# Patient Record
Sex: Female | Born: 1970 | Race: Black or African American | Hispanic: No | Marital: Married | Smoking: Current every day smoker
Health system: Southern US, Community
[De-identification: ages and names within clinical notes are randomized; demographics above are authoritative.]

## PROBLEM LIST (undated history)

## (undated) DIAGNOSIS — K859 Acute pancreatitis without necrosis or infection, unspecified: Secondary | ICD-10-CM

---

## 1999-08-03 ENCOUNTER — Encounter: Payer: Self-pay | Admitting: Emergency Medicine

## 1999-08-03 ENCOUNTER — Emergency Department (HOSPITAL_COMMUNITY): Admission: EM | Admit: 1999-08-03 | Discharge: 1999-08-03 | Payer: Self-pay | Admitting: Emergency Medicine

## 2005-05-25 ENCOUNTER — Emergency Department (HOSPITAL_COMMUNITY): Admission: EM | Admit: 2005-05-25 | Discharge: 2005-05-25 | Payer: Self-pay | Admitting: Emergency Medicine

## 2013-04-03 ENCOUNTER — Emergency Department (HOSPITAL_BASED_OUTPATIENT_CLINIC_OR_DEPARTMENT_OTHER)
Admission: EM | Admit: 2013-04-03 | Discharge: 2013-04-03 | Disposition: A | Discharge status: Home / Self Care | Payer: No Typology Code available for payment source | Attending: Emergency Medicine | Admitting: Emergency Medicine

## 2013-04-03 ENCOUNTER — Encounter (HOSPITAL_BASED_OUTPATIENT_CLINIC_OR_DEPARTMENT_OTHER): Payer: Self-pay | Admitting: *Deleted

## 2013-04-03 DIAGNOSIS — S59909A Unspecified injury of unspecified elbow, initial encounter: Secondary | ICD-10-CM | POA: Insufficient documentation

## 2013-04-03 DIAGNOSIS — M791 Myalgia, unspecified site: Secondary | ICD-10-CM

## 2013-04-03 DIAGNOSIS — S59919A Unspecified injury of unspecified forearm, initial encounter: Secondary | ICD-10-CM | POA: Insufficient documentation

## 2013-04-03 DIAGNOSIS — Y9389 Activity, other specified: Secondary | ICD-10-CM | POA: Insufficient documentation

## 2013-04-03 DIAGNOSIS — IMO0002 Reserved for concepts with insufficient information to code with codable children: Secondary | ICD-10-CM | POA: Insufficient documentation

## 2013-04-03 DIAGNOSIS — F17200 Nicotine dependence, unspecified, uncomplicated: Secondary | ICD-10-CM | POA: Insufficient documentation

## 2013-04-03 DIAGNOSIS — Y92410 Unspecified street and highway as the place of occurrence of the external cause: Secondary | ICD-10-CM | POA: Insufficient documentation

## 2013-04-03 DIAGNOSIS — S6990XA Unspecified injury of unspecified wrist, hand and finger(s), initial encounter: Secondary | ICD-10-CM | POA: Insufficient documentation
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
## 2013-04-03 NOTE — ED Notes (Signed)
MD at bedside. 

## 2015-02-22 ENCOUNTER — Emergency Department (HOSPITAL_BASED_OUTPATIENT_CLINIC_OR_DEPARTMENT_OTHER)
Admission: EM | Admit: 2015-02-22 | Discharge: 2015-02-22 | Disposition: A | Discharge status: Home / Self Care | Payer: Self-pay | Attending: Emergency Medicine | Admitting: Emergency Medicine

## 2015-02-22 ENCOUNTER — Emergency Department (HOSPITAL_BASED_OUTPATIENT_CLINIC_OR_DEPARTMENT_OTHER): Payer: Self-pay

## 2015-02-22 ENCOUNTER — Encounter (HOSPITAL_BASED_OUTPATIENT_CLINIC_OR_DEPARTMENT_OTHER): Payer: Self-pay

## 2015-02-22 DIAGNOSIS — Z3202 Encounter for pregnancy test, result negative: Secondary | ICD-10-CM | POA: Insufficient documentation

## 2015-02-22 DIAGNOSIS — Z79899 Other long term (current) drug therapy: Secondary | ICD-10-CM | POA: Insufficient documentation

## 2015-02-22 DIAGNOSIS — K8500 Idiopathic acute pancreatitis without necrosis or infection: Secondary | ICD-10-CM

## 2015-02-22 DIAGNOSIS — Z9889 Other specified postprocedural states: Secondary | ICD-10-CM | POA: Insufficient documentation

## 2015-02-22 DIAGNOSIS — Z720 Tobacco use: Secondary | ICD-10-CM | POA: Insufficient documentation

## 2015-02-22 DIAGNOSIS — R160 Hepatomegaly, not elsewhere classified: Secondary | ICD-10-CM | POA: Insufficient documentation

## 2015-02-22 DIAGNOSIS — K839 Disease of biliary tract, unspecified: Secondary | ICD-10-CM | POA: Insufficient documentation

## 2015-02-22 DIAGNOSIS — K828 Other specified diseases of gallbladder: Secondary | ICD-10-CM

## 2015-02-22 LAB — URINALYSIS, ROUTINE W REFLEX MICROSCOPIC
Bilirubin Urine: NEGATIVE
Glucose, UA: NEGATIVE mg/dL
Hgb urine dipstick: NEGATIVE
Ketones, ur: NEGATIVE mg/dL
Nitrite: NEGATIVE
Protein, ur: NEGATIVE mg/dL
Specific Gravity, Urine: 1.008 (ref 1.005–1.030)
Urobilinogen, UA: 0.2 mg/dL (ref 0.0–1.0)
pH: 5.5 (ref 5.0–8.0)

## 2015-02-22 LAB — URINE MICROSCOPIC-ADD ON

## 2015-02-22 LAB — COMPREHENSIVE METABOLIC PANEL
ALBUMIN: 3.5 g/dL (ref 3.5–5.0)
ALT: 13 U/L — AB (ref 14–54)
AST: 15 U/L (ref 15–41)
Alkaline Phosphatase: 78 U/L (ref 38–126)
Anion gap: 8 (ref 5–15)
BUN: 5 mg/dL — ABNORMAL LOW (ref 6–20)
CALCIUM: 8.4 mg/dL — AB (ref 8.9–10.3)
CO2: 22 mmol/L (ref 22–32)
CREATININE: 0.74 mg/dL (ref 0.44–1.00)
Chloride: 106 mmol/L (ref 101–111)
GFR calc Af Amer: >60 mL/min (ref >60)
GFR calc non Af Amer: >60 mL/min (ref >60)
Glucose, Bld: 109 mg/dL — ABNORMAL HIGH (ref 70–99)
Potassium: 3.1 mmol/L — ABNORMAL LOW (ref 3.5–5.1)
Sodium: 136 mmol/L (ref 135–145)
Total Bilirubin: 0.5 mg/dL (ref 0.3–1.2)
Total Protein: 7.4 g/dL (ref 6.5–8.1)

## 2015-02-22 LAB — CBC WITH DIFFERENTIAL/PLATELET
BASOS ABS: 0 10*3/uL (ref 0.0–0.1)
Basophils Relative: 0 % (ref 0–1)
Eosinophils Absolute: 0.5 10*3/uL (ref 0.0–0.7)
Eosinophils Relative: 5 % (ref 0–5)
HCT: 39.2 % (ref 36.0–46.0)
HEMOGLOBIN: 13.7 g/dL (ref 12.0–15.0)
LYMPHS PCT: 23 % (ref 12–46)
Lymphs Abs: 2.4 10*3/uL (ref 0.7–4.0)
MCH: 32.8 pg (ref 26.0–34.0)
MCHC: 34.9 g/dL (ref 30.0–36.0)
MCV: 93.8 fL (ref 78.0–100.0)
MONO ABS: 0.7 10*3/uL (ref 0.1–1.0)
MONOS PCT: 6 % (ref 3–12)
Neutro Abs: 6.8 10*3/uL (ref 1.7–7.7)
Neutrophils Relative %: 66 % (ref 43–77)
Platelets: 214 10*3/uL (ref 150–400)
RBC: 4.18 MIL/uL (ref 3.87–5.11)
RDW: 12.3 % (ref 11.5–15.5)
WBC: 10.4 10*3/uL (ref 4.0–10.5)

## 2015-02-22 LAB — LIPASE, BLOOD: LIPASE: 116 U/L — AB (ref 22–51)

## 2015-02-22 LAB — PREGNANCY, URINE: Preg Test, Ur: NEGATIVE

## 2015-02-22 MED ORDER — ONDANSETRON HCL 4 MG/2ML IJ SOLN
4.0000 mg | Freq: Once | INTRAMUSCULAR | Status: AC
Start: 2015-02-22 — End: 2015-02-22
  Administered 2015-02-22: 4 mg via INTRAVENOUS
  Filled 2015-02-22: qty 2

## 2015-02-22 MED ORDER — HYDROCODONE-ACETAMINOPHEN 5-325 MG PO TABS: 1.0000 | ORAL_TABLET | ORAL | Status: DC | PRN

## 2015-02-22 MED ORDER — PROMETHAZINE HCL 25 MG PO TABS
25.0000 mg | ORAL_TABLET | Freq: Four times a day (QID) | ORAL | Status: DC | PRN
Start: 2015-02-22 — End: 2017-01-25

## 2015-02-22 MED ORDER — SODIUM CHLORIDE 0.9 % IV BOLUS (SEPSIS)
1000.0000 mL | Freq: Once | INTRAVENOUS | Status: AC
  Administered 2015-02-22: 1000 mL via INTRAVENOUS

## 2015-02-22 MED ORDER — POTASSIUM CHLORIDE CRYS ER 20 MEQ PO TBCR
40.0000 meq | EXTENDED_RELEASE_TABLET | Freq: Once | ORAL | Status: AC
  Administered 2015-02-22: 40 meq via ORAL
  Filled 2015-02-22: qty 2

## 2015-02-22 MED ORDER — MORPHINE SULFATE 4 MG/ML IJ SOLN
4.0000 mg | Freq: Once | INTRAMUSCULAR | Status: AC
  Administered 2015-02-22: 4 mg via INTRAVENOUS
  Filled 2015-02-22: qty 1

## 2015-02-22 NOTE — ED Provider Notes (Signed)
CSN: [id number]     Arrival date & time 02/22/15  1758 History  This chart was scribed for Rolan Bucco, MD by Richarda Overlie, ED Scribe. This patient was seen in room MH03/MH03 and the patient's care was started 7:37 PM.      Chief Complaint  Patient presents with  . Flank Pain   The history is provided by the patient. No language interpreter was used.   HPI Comments: Kylie Irwin is a 44 y.o. female with no significant medical history who presents to the Emergency Department complaining of constant, right sided upper abdominal pain for the last 3 days. The pain starts in her epigastrium and radiates to her right upper quadrant and in her right back. Pt reports that she has vomited twice since the onset of her symptoms. She reports that she has noticed some minimal blood in her stool when she wiped as well, twice. Pt states she has a decrease in appetite due to her pain. She states that she has taken ibuprofen with some minimal, short term relief. She denies fever, trouble with urination, dysuria or rectal pain.  History reviewed. No pertinent past medical history. Past Surgical History  Procedure Laterality Date  . Cesarean section     No family history on file. History  Substance Use Topics  . Smoking status: Current Every Day Smoker  . Smokeless tobacco: Not on file  . Alcohol Use: No   OB History    No data available     Review of Systems  Constitutional: Negative for fever.  Respiratory: Negative for chest tightness.   Cardiovascular: Positive for chest pain.  Gastrointestinal: Positive for nausea, vomiting, abdominal pain and blood in stool. Negative for rectal pain.  Genitourinary: Positive for flank pain. Negative for dysuria and difficulty urinating.  All other systems reviewed and are negative.   Allergies  Review of patient's allergies indicates no known allergies.  Home Medications   Prior to Admission medications   Medication Sig Start Date End Date  Taking? Authorizing Provider  HYDROcodone-acetaminophen (NORCO/VICODIN) 5-325 MG per tablet Take 1-2 tablets by mouth every 4 (four) hours as needed. 02/22/15   Rolan Bucco, MD  promethazine (PHENERGAN) 25 MG tablet Take 1 tablet (25 mg total) by mouth every 6 (six) hours as needed for nausea or vomiting. 02/22/15   Rolan Bucco, MD   BP 109/87 mmHg  Pulse 88  Temp(Src) 98.5 F (36.9 C) (Oral)  Resp 18  Ht  (1.651 m)  Wt 162 lb (73.483 kg)  BMI 26.96 kg/m2  SpO2 100%  LMP 02/15/2015 Physical Exam  Constitutional: She is oriented to person, place, and time. She appears well-developed and well-nourished.  HENT:  Head: Normocephalic and atraumatic.  Eyes: Pupils are equal, round, and reactive to light. Right eye exhibits no discharge. Left eye exhibits no discharge.  Neck: Normal range of motion. Neck supple. No tracheal deviation present.  Cardiovascular: Normal rate, regular rhythm and normal heart sounds.   Pulmonary/Chest: Effort normal and breath sounds normal. No respiratory distress. She has no wheezes. She has no rales. She exhibits no tenderness.  Abdominal: Soft. Bowel sounds are normal. She exhibits no distension. There is tenderness (Positive tenderness to the epigastrium and right upper quadrant). There is no rebound and no guarding.  Musculoskeletal: Normal range of motion. She exhibits no edema.  Lymphadenopathy:    She has no cervical adenopathy.  Neurological: She is alert and oriented to person, place, and time.  Skin: Skin is warm and  dry. No rash noted.  Psychiatric: She has a normal mood and affect. Her behavior is normal.  Nursing note and vitals reviewed.   ED Course  Procedures   DIAGNOSTIC STUDIES: Oxygen Saturation is 100% on RA, normal by my interpretation.    COORDINATION OF CARE: 7:40 PM Discussed treatment plan with pt at bedside and pt agreed to plan.   Labs Review Results for orders placed or performed during the hospital encounter of  02/22/15  Urinalysis, Routine w reflex microscopic  Result Value Ref Range   Color, Urine YELLOW YELLOW   APPearance CLEAR CLEAR   Specific Gravity, Urine 1.008 1.005 - 1.030   pH 5.5 5.0 - 8.0   Glucose, UA NEGATIVE NEGATIVE mg/dL   Hgb urine dipstick NEGATIVE NEGATIVE   Bilirubin Urine NEGATIVE NEGATIVE   Ketones, ur NEGATIVE NEGATIVE mg/dL   Protein, ur NEGATIVE NEGATIVE mg/dL   Urobilinogen, UA 0.2 0.0 - 1.0 mg/dL   Nitrite NEGATIVE NEGATIVE   Leukocytes, UA SMALL (A) NEGATIVE  Pregnancy, urine  Result Value Ref Range   Preg Test, Ur NEGATIVE NEGATIVE  Urine microscopic-add on  Result Value Ref Range   Squamous Epithelial / LPF RARE RARE   WBC, UA 3-6 <3 WBC/hpf   Bacteria, UA RARE RARE  Comprehensive metabolic panel  Result Value Ref Range   Sodium 136 135 - 145 mmol/L   Potassium 3.1 (L) 3.5 - 5.1 mmol/L   Chloride 106 101 - 111 mmol/L   CO2 22 22 - 32 mmol/L   Glucose, Bld 109 (H) 70 - 99 mg/dL   BUN 5 (L) 6 - 20 mg/dL   Creatinine, Ser 1.61 0.44 - 1.00 mg/dL   Calcium 8.4 (L) 8.9 - 10.3 mg/dL   Total Protein 7.4 6.5 - 8.1 g/dL   Albumin 3.5 3.5 - 5.0 g/dL   AST 15 15 - 41 U/L   ALT 13 (L) 14 - 54 U/L   Alkaline Phosphatase 78 38 - 126 U/L   Total Bilirubin 0.5 0.3 - 1.2 mg/dL   GFR calc non Af Amer >60 >60 mL/min   GFR calc Af Amer >60 >60 mL/min   Anion gap 8 5 - 15  Lipase, blood  Result Value Ref Range   Lipase 116 (H) 22 - 51 U/L  CBC with Differential  Result Value Ref Range   WBC 10.4 4.0 - 10.5 K/uL   RBC 4.18 3.87 - 5.11 MIL/uL   Hemoglobin 13.7 12.0 - 15.0 g/dL   HCT 09.6 04.5 - 40.9 %   MCV 93.8 78.0 - 100.0 fL   MCH 32.8 26.0 - 34.0 pg   MCHC 34.9 30.0 - 36.0 g/dL   RDW 81.1 91.4 - 78.2 %   Platelets 214 150 - 400 K/uL   Neutrophils Relative % 66 43 - 77 %   Neutro Abs 6.8 1.7 - 7.7 K/uL   Lymphocytes Relative 23 12 - 46 %   Lymphs Abs 2.4 0.7 - 4.0 K/uL   Monocytes Relative 6 3 - 12 %   Monocytes Absolute 0.7 0.1 - 1.0 K/uL    Eosinophils Relative 5 0 - 5 %   Eosinophils Absolute 0.5 0.0 - 0.7 K/uL   Basophils Relative 0 0 - 1 %   Basophils Absolute 0.0 0.0 - 0.1 K/uL   US Abdomen Complete  02/22/2015   CLINICAL DATA:  RIGHT flank and back pain.  Symptoms for 4 days.  EXAM: ULTRASOUND ABDOMEN COMPLETE  COMPARISON:  None.  FINDINGS: Gallbladder:  Gallbladder sludge is present along with a 4 mm polyp along the posterior wall. Gallbladder wall measures 2 mm. No sonographic Murphy sign.  Common bile duct: Diameter: 2 mm.  Liver: Echogenic mass in the LEFT hepatic lobe measuring 34 mm x 28 mm x 25 mm.  IVC: No abnormality visualized.  Pancreas: Visualized portion unremarkable.  Spleen: Size and appearance within normal limits.  Right Kidney: Length: 9.8 cm. Echogenicity within normal limits. No mass or hydronephrosis visualized.  Left Kidney: Length: 10.2 cm. Echogenicity within normal limits. No mass or hydronephrosis visualized.  Abdominal aorta: No aneurysm visualized.  Other findings: None.  IMPRESSION: 1. Biliary sludge and tiny gallbladder polyp. No follow-up required. 2. No findings of acute cholecystitis. 3. 44 mm x 28 mm x 25 mm echogenic mass in the LEFT hepatic lobe. The sonographic appearance is nonspecific. Liver MRI with and without contrast is recommended for further assessment. Most likely entities are focal nodular hyperplasia or hemangioma. Non-emergent MRI should be deferred until patient has been discharged for the acute illness, and can optimally cooperate with positioning and breath-holding instructions.   Electronically Signed   By: Andreas NewportGeoffrey  Lamke M.D.   On: 02/22/2015 22:16      Imaging Review [place] Abdomen Complete  02/22/2015   CLINICAL DATA:  RIGHT flank and back pain.  Symptoms for 4 days.  EXAM: ULTRASOUND ABDOMEN COMPLETE  COMPARISON:  None.  FINDINGS: Gallbladder: Gallbladder sludge is present along with a 4 mm polyp along the posterior wall. Gallbladder wall measures 2 mm. No sonographic Murphy sign.   Common bile duct: Diameter: 2 mm.  Liver: Echogenic mass in the LEFT hepatic lobe measuring 34 mm x 28 mm x 25 mm.  IVC: No abnormality visualized.  Pancreas: Visualized portion unremarkable.  Spleen: Size and appearance within normal limits.  Right Kidney: Length: 9.8 cm. Echogenicity within normal limits. No mass or hydronephrosis visualized.  Left Kidney: Length: 10.2 cm. Echogenicity within normal limits. No mass or hydronephrosis visualized.  Abdominal aorta: No aneurysm visualized.  Other findings: None.  IMPRESSION: 1. Biliary sludge and tiny gallbladder polyp. No follow-up required. 2. No findings of acute cholecystitis. 3. 44 mm x 28 mm x 25 mm echogenic mass in the LEFT hepatic lobe. The sonographic appearance is nonspecific. Liver MRI with and without contrast is recommended for further assessment. Most likely entities are focal nodular hyperplasia or hemangioma. Non-emergent MRI should be deferred until patient has been discharged for the acute illness, and can optimally cooperate with positioning and breath-holding instructions.   Electronically Signed   By: Andreas NewportGeoffrey  Lamke M.D.   On: 02/22/2015 22:16     EKG Interpretation None      MDM   Final diagnoses:  Idiopathic acute pancreatitis  Gallbladder sludge  Liver mass   Patient presents with pain across her upper abdomen. She does have an elevation in her lipase consistent with pancreatitis. I did ask her about alcohol use and she states that she does drink fairly frequently. I advised her that this may be the cause of her pancreatitis and that she needs to stop drinking as it may make it worse. The ultrasound showed no gallstones. There was some gallbladder sludge. There is no evidence of cholecystitis. Her LFTs are normal. Her pain is improved in the ED and she has no ongoing vomiting. I feel that she can be safely discharged home. I advised her to use a clear liquid diet. Advised her return to the ED if she has any worsening symptoms.  I did advise her that she needs to have outpatient follow-up regarding the liver mass and that she will need an outpatient MRI. I also advised her that she has some biliary sludge but also may need outpatient follow-up. I gave her referral to follow-up with the [facility].  I personally performed the services described in this documentation, which was scribed in my presence.  The recorded information has been reviewed and considered.      Rolan Bucco, MD 02/22/15 [id number]

## 2015-02-22 NOTE — ED Notes (Signed)
C/o right flank pain and  Epigastric abd pain x 3 days

## 2015-02-22 NOTE — Discharge Instructions (Signed)
You have a mass on your liver that needs a follow up MRI done.  This can be ordered by a primary care provider.  Avoid alcohol.    Acute Pancreatitis Acute pancreatitis is a disease in which the pancreas becomes suddenly inflamed. The pancreas is a large gland located behind your stomach. The pancreas produces enzymes that help digest food. The pancreas also releases the hormones glucagon and insulin that help regulate blood sugar. Damage to the pancreas occurs when the digestive enzymes from the pancreas are activated and begin attacking the pancreas before being released into the intestine. Most acute attacks last a couple of days and can cause serious complications. Some people become dehydrated and develop low blood pressure. In severe cases, bleeding into the pancreas can lead to shock and can be life-threatening. The lungs, heart, and kidneys may fail. CAUSES  Pancreatitis can happen to anyone. In some cases, the cause is unknown. Most cases are caused by:  Alcohol abuse.  Gallstones. Other less common causes are:  Certain medicines.  Exposure to certain chemicals.  Infection.  Damage caused by an accident (trauma).  Abdominal surgery. SYMPTOMS   Pain in the upper abdomen that may radiate to the back.  Tenderness and swelling of the abdomen.  Nausea and vomiting. DIAGNOSIS  Your caregiver will perform a physical exam. Blood and stool tests may be done to confirm the diagnosis. Imaging tests may also be done, such as X-rays, CT scans, or an ultrasound of the abdomen. TREATMENT  Treatment usually requires a stay in the hospital. Treatment may include:  Pain medicine.  Fluid replacement through an intravenous line (IV).  Placing a tube in the stomach to remove stomach contents and control vomiting.  Not eating for 3 or 4 days. This gives your pancreas a rest, because enzymes are not being produced that can cause further damage.  Antibiotic medicines if your condition is  caused by an infection.  Surgery of the pancreas or gallbladder. HOME CARE INSTRUCTIONS   Follow the diet advised by your caregiver. This may involve avoiding alcohol and decreasing the amount of fat in your diet.  Eat smaller, more frequent meals. This reduces the amount of digestive juices the pancreas produces.  Drink enough fluids to keep your urine clear or pale yellow.  Only take over-the-counter or prescription medicines as directed by your caregiver.  Avoid drinking alcohol if it caused your condition.  Do not smoke.  Get plenty of rest.  Check your blood sugar at home as directed by your caregiver.  Keep all follow-up appointments as directed by your caregiver. SEEK MEDICAL CARE IF:   You do not recover as quickly as expected.  You develop new or worsening symptoms.  You have persistent pain, weakness, or nausea.  You recover and then have another episode of pain. SEEK IMMEDIATE MEDICAL CARE IF:   You are unable to eat or keep fluids down.  Your pain becomes severe.  You have a fever or persistent symptoms for more than 2 to 3 days.  You have a fever and your symptoms suddenly get worse.  Your skin or the white part of your eyes turn yellow (jaundice).  You develop vomiting.  You feel dizzy, or you faint.  Your blood sugar is high (over 300 mg/dL). MAKE SURE YOU:   Understand these instructions.  Will watch your condition.  Will get help right away if you are not doing well or get worse. Document Released: 10/09/2005 Document Revised: 04/09/2012 Document Reviewed: 01/18/2012  [employer]® Patient Information ©2015 [employer]. This information is not intended to replace advice given to you by your health care provider. Make sure you discuss any questions you have with your health care provider. ° °

## 2015-03-02 ENCOUNTER — Encounter: Payer: Self-pay | Admitting: Family Medicine

## 2015-03-02 ENCOUNTER — Ambulatory Visit: Payer: Self-pay | Attending: Family Medicine | Admitting: Family Medicine

## 2015-03-02 VITALS — BP 119/84 | HR 94 | Temp 98.8°F | Resp 18 | Ht 65.0 in | Wt 160.0 lb

## 2015-03-02 DIAGNOSIS — Z720 Tobacco use: Secondary | ICD-10-CM

## 2015-03-02 DIAGNOSIS — K859 Acute pancreatitis, unspecified: Secondary | ICD-10-CM | POA: Insufficient documentation

## 2015-03-02 DIAGNOSIS — K852 Alcohol induced acute pancreatitis without necrosis or infection: Secondary | ICD-10-CM

## 2015-03-02 DIAGNOSIS — K5909 Other constipation: Secondary | ICD-10-CM

## 2015-03-02 DIAGNOSIS — K769 Liver disease, unspecified: Secondary | ICD-10-CM | POA: Insufficient documentation

## 2015-03-02 DIAGNOSIS — K7689 Other specified diseases of liver: Secondary | ICD-10-CM | POA: Insufficient documentation

## 2015-03-02 DIAGNOSIS — E876 Hypokalemia: Secondary | ICD-10-CM | POA: Insufficient documentation

## 2015-03-02 DIAGNOSIS — K5900 Constipation, unspecified: Secondary | ICD-10-CM | POA: Insufficient documentation

## 2015-03-02 DIAGNOSIS — F17200 Nicotine dependence, unspecified, uncomplicated: Secondary | ICD-10-CM | POA: Insufficient documentation

## 2015-03-02 LAB — LIPASE: Lipase: 116 U/L — ABNORMAL HIGH (ref 0–75)

## 2015-03-02 LAB — BASIC METABOLIC PANEL
BUN: 9 mg/dL (ref 6–23)
CHLORIDE: 103 meq/L (ref 96–112)
CO2: 24 meq/L (ref 19–32)
CREATININE: 0.87 mg/dL (ref 0.50–1.10)
Calcium: 9.7 mg/dL (ref 8.4–10.5)
Glucose, Bld: 90 mg/dL (ref 70–99)
Potassium: 5.1 mEq/L (ref 3.5–5.3)
SODIUM: 138 meq/L (ref 135–145)

## 2015-03-02 LAB — AMYLASE: Amylase: 52 U/L (ref 0–105)

## 2015-03-02 MED ORDER — LACTULOSE 10 GM/15ML PO SOLN: 10.0000 g | Freq: Three times a day (TID) | ORAL | Status: DC

## 2015-03-02 MED ORDER — NICOTINE 7 MG/24HR TD PT24: 7.0000 mg | MEDICATED_PATCH | Freq: Every day | TRANSDERMAL | Status: DC

## 2015-03-02 NOTE — Progress Notes (Signed)
Subjective:    Patient ID: Kylie Irwin, female    DOB: 08/05/1971, 44 y.o.   MRN: [id number]  HPI  Kylie Irwin is a 44 year old female with no significant past medical history who had presented to the emergency room with right upper quadrant pain, vomiting and decreased appetite.  On presentation vitals were stable, UA revealed trace nitrates, pregnancy test was negative, she had an elevated lipase consistent with pancreatitis. Abdominal ultrasound was negative for cholelithiasis or cholecystitis but revealed some gallbladder sludge. It also revealed a 44 x 28 x 25 mm echogenic liver mass in the left lobe and an MRI was recommended. She received some hydrocodone and Phenergan and symptoms gradually improved over time and she was subsequently discharged.  Interval history: She reports abdominal pain has resolved and she has no nausea or vomiting. Complains of constipation, bloating and not having a sense of full bowel emptying; denies hematochezia. She would also like help in quitting smoking.  History reviewed. No pertinent past medical history.  Past Surgical History  Procedure Laterality Date  . Cesarean section      History reviewed. No pertinent family history.  History   Social History  . Marital Status: Married    Spouse Name: N/A  . Number of Children: N/A  . Years of Education: N/A   Occupational History  . Not on file.   Social History Main Topics  . Smoking status: Current Every Day Smoker  . Smokeless tobacco: Not on file  . Alcohol Use: Yes     Comment: occassional  . Drug Use: No  . Sexual Activity: Not on file   Other Topics Concern  . Not on file   Social History Narrative    Allergies  Allergen Reactions  . Shrimp [Shellfish Allergy] Hives    Current Outpatient Prescriptions on File Prior to Visit  Medication Sig Dispense Refill  . HYDROcodone-acetaminophen (NORCO/VICODIN) 5-325 MG per tablet Take 1-2 tablets by mouth every 4  (four) hours as needed. 15 tablet 0  . promethazine (PHENERGAN) 25 MG tablet Take 1 tablet (25 mg total) by mouth every 6 (six) hours as needed for nausea or vomiting. (Patient not taking: Reported on 03/02/2015) 15 tablet 0   No current facility-administered medications on file prior to visit.     Review of Systems  Constitutional: Negative for activity change, appetite change and fatigue.  HENT: Negative for congestion, sinus pressure and sore throat.   Eyes: Negative for visual disturbance.  Respiratory: Negative for cough, chest tightness, shortness of breath and wheezing.   Cardiovascular: Negative for chest pain and palpitations.  Gastrointestinal: Positive for constipation. Negative for abdominal pain and abdominal distention.  Endocrine: Negative for polydipsia.  Genitourinary: Negative for dysuria and frequency.  Musculoskeletal: Negative for back pain and arthralgias.  Skin: Negative for rash.  Neurological: Negative for tremors, light-headedness and numbness.  Hematological: Does not bruise/bleed easily.  Psychiatric/Behavioral: Negative for behavioral problems and agitation.         Objective: Filed Vitals:   03/02/15 1447  BP: 119/84  Pulse: 94  Temp: 98.8 F (37.1 C)  Resp: 18      Physical Exam  Constitutional: She is oriented to person, place, and time. She appears well-developed and well-nourished. No distress.  HENT:  Head: Normocephalic.  Right Ear: External ear normal.  Left Ear: External ear normal.  Nose: Nose normal.  Mouth/Throat: Oropharynx is clear and moist.  Eyes: Conjunctivae and EOM are normal. Pupils are equal, round, and  reactive to light.  Neck: Normal range of motion. No JVD present.  Cardiovascular: Normal rate, regular rhythm, normal heart sounds and intact distal pulses.  Exam reveals no gallop.   No murmur heard. Pulmonary/Chest: Effort normal and breath sounds normal. No respiratory distress. She has no wheezes. She has no rales.  She exhibits no tenderness.  Abdominal: Soft. Bowel sounds are normal. She exhibits no distension and no mass. There is no tenderness.  Musculoskeletal: Normal range of motion. She exhibits no edema or tenderness.  Neurological: She is alert and oriented to person, place, and time. She has normal reflexes.  Skin: Skin is warm and dry. She is not diaphoretic.  Psychiatric: She has a normal mood and affect.    CMP Latest Ref Rng 02/22/2015  Glucose 70 - 99 mg/dL 161(W)  BUN 6 - 20 mg/dL 5(L)  Creatinine 9.60 - 1.00 mg/dL 4.54  Sodium 098 - 119 mmol/L 136  Potassium 3.5 - 5.1 mmol/L 3.1(L)  Chloride 101 - 111 mmol/L 106  CO2 22 - 32 mmol/L 22  Calcium 8.9 - 10.3 mg/dL 1.4(N)  Total Protein 6.5 - 8.1 g/dL 7.4  Total Bilirubin 0.3 - 1.2 mg/dL 0.5  Alkaline Phos 38 - 126 U/L 78  AST 15 - 41 U/L 15  ALT 14 - 54 U/L 13(L)     EXAM: ULTRASOUND ABDOMEN COMPLETE  COMPARISON: None.  FINDINGS: Gallbladder: Gallbladder sludge is present along with a 4 mm polyp along the posterior wall. Gallbladder wall measures 2 mm. No sonographic Murphy sign.  Common bile duct: Diameter: 2 mm.  Liver: Echogenic mass in the LEFT hepatic lobe measuring 34 mm x 28 mm x 25 mm.  IVC: No abnormality visualized.  Pancreas: Visualized portion unremarkable.  Spleen: Size and appearance within normal limits.  Right Kidney: Length: 9.8 cm. Echogenicity within normal limits. No mass or hydronephrosis visualized.  Left Kidney: Length: 10.2 cm. Echogenicity within normal limits. No mass or hydronephrosis visualized.  Abdominal aorta: No aneurysm visualized.  Other findings: None.  IMPRESSION: 1. Biliary sludge and tiny gallbladder polyp. No follow-up required. 2. No findings of acute cholecystitis. 3. 44 mm x 28 mm x 25 mm echogenic mass in the LEFT hepatic lobe. The sonographic appearance is nonspecific. Liver MRI with and without contrast is recommended for further assessment. Most  likely entities are focal nodular hyperplasia or hemangioma. Non-emergent MRI should be deferred until patient has been discharged for the acute illness, and can optimally cooperate with positioning and breath-holding instructions.   Electronically Signed  By: Andreas Newport M.D.  On: 02/22/2015 22:16        Assessment & Plan:  44 year old female with a recent ED presentation for abdominal pain, diagnosed with acute pancreatitis an incidental finding of a liver lesion in the left lobe.  Pancreatitis: Likely alcohol induced. Symptoms have resolved. Patient states she has quit alcohol.  Liver lesion: MRI without contrast of the abdomen ordered for follow-up.  Constipation: Dietary modifications to increase fiber intake. Placed on lactulose.  Tobacco use disorder: Smoking cessation support: smoking cessation hotline: 1-800-QUIT-NOW.  Smoking cessation classes are available through [facility]. Call [phone] or visit our website at [email or web address].  Spent 3 minutes counseling on smoking cessation and patient is ready to quit. Placed on nicotine patches  Hypokalemia: Likely from vomiting at presentation to the ED. We'll repeat basic metabolic panel today.  Disclaimer: This note was dictated with voice recognition software. Similar sounding words can inadvertently be transcribed and this  note may contain transcription errors which may not have been corrected upon publication of note.

## 2015-03-02 NOTE — Progress Notes (Signed)
Patient went to ED with pancreatitis, gall bladder sludge and liver mass. Patient feels bloated, having small bowel movements that are not normal per patient. Patient denies pain currently. Patient ready to quit smoking.

## 2015-03-02 NOTE — Patient Instructions (Signed)
Nicotine Addiction Nicotine can act as both a stimulant (excites/activates) and a sedative (calms/quiets). Immediately after exposure to nicotine, there is a "kick" caused in part by the drug's stimulation of the adrenal glands and resulting discharge of adrenaline (epinephrine). The rush of adrenaline stimulates the body and causes a sudden release of sugar. This means that smokers are always slightly hyperglycemic. Hyperglycemic means that the blood sugar is high, just like in diabetics. Nicotine also decreases the amount of insulin which helps control sugar levels in the body. There is an increase in blood pressure, breathing, and the rate of heart beats.  In addition, nicotine indirectly causes a release of dopamine in the brain that controls pleasure and motivation. A similar reaction is seen with other drugs of abuse, such as cocaine and heroin. This dopamine release is thought to cause the pleasurable sensations when smoking. In some different cases, nicotine can also create a calming effect, depending on sensitivity of the smoker's nervous system and the dose of nicotine taken. WHAT HAPPENS WHEN NICOTINE IS TAKEN FOR LONG PERIODS OF TIME?  Long-term use of nicotine results in addiction. It is difficult to stop.  Repeated use of nicotine creates tolerance. Higher doses of nicotine are needed to get the "kick." When nicotine use is stopped, withdrawal may last a month or more. Withdrawal may begin within a few hours after the last cigarette. Symptoms peak within the first few days and may lessen within a few weeks. For some people, however, symptoms may last for months or longer. Withdrawal symptoms include:   Irritability.  Craving.  Learning and attention deficits.  Sleep disturbances.  Increased appetite. Craving for tobacco may last for 6 months or longer. Many behaviors done while using nicotine can also play a part in the severity of withdrawal symptoms. For some people, the feel,  smell, and sight of a cigarette and the ritual of obtaining, handling, lighting, and smoking the cigarette are closely linked with the pleasure of smoking. When stopped, they also miss the related behaviors which make the withdrawal or craving worse. While nicotine gum and patches may lessen the drug aspects of withdrawal, cravings often persist. WHAT ARE THE MEDICAL CONSEQUENCES OF NICOTINE USE?  Nicotine addiction accounts for one-third of all cancers. The top cancer caused by tobacco is lung cancer. Lung cancer is the number one cancer killer of both men and women.  Smoking is also associated with cancers of the:  Mouth.  Pharynx.  Larynx.  Esophagus.  Stomach.  Pancreas.  Cervix.  Kidney.  Ureter.  Bladder.  Smoking also causes lung diseases such as lasting (chronic) bronchitis and emphysema.  It worsens asthma in adults and children.  Smoking increases the risk of heart disease, including:  Stroke.  Heart attack.  Vascular disease.  Aneurysm.  Passive or secondary smoke can also increase medical risks including:  Asthma in children.  Sudden Infant Death Syndrome (SIDS).  Additionally, dropped cigarettes are the leading cause of residential fire fatalities.  Nicotine poisoning has been reported from accidental ingestion of tobacco products by children and pets. Death usually results in a few minutes from respiratory failure (when a person stops breathing) caused by paralysis. TREATMENT   Medication. Nicotine replacement medicines such as nicotine gum and the patch are used to stop smoking. These medicines gradually lower the dosage of nicotine in the body. These medicines do not contain the carbon monoxide and other toxins found in tobacco smoke.  Hypnotherapy.  Relaxation therapy.  Nicotine Anonymous (a 12-step support   program). Find times and locations in your local yellow pages. Document Released: 06/14/2004 Document Revised: 01/01/2012 Document  Reviewed: 12/05/2013 [employer] Patient Information 2015 [employer]. This information is not intended to replace advice given to you by your health care provider. Make sure you discuss any questions you have with your health care provider.  

## 2015-03-03 ENCOUNTER — Telehealth: Payer: Self-pay

## 2015-03-03 NOTE — Telephone Encounter (Signed)
Nurse called patient, patient aware of normal labs. Patient questioning if she still needs MRI and to take medications prescribed by Dr. Venetia NightAmao at last office visit. Nurse advised patient to keep taking medications as instructed by Dr. Venetia NightAmao and someone will call her with MRI appointment because she does still need MRI. Patient voices understanding.

## 2015-03-03 NOTE — Telephone Encounter (Signed)
-----   Message from Jaclyn ShaggyEnobong Amao, MD sent at 03/03/2015 12:20 PM EDT ----- Please inform the patient that labs are normal. Thank you.

## 2015-03-03 NOTE — Progress Notes (Signed)
Quick Note:  Please inform the patient that labs are normal. Thank you. ______ 

## 2015-03-24 ENCOUNTER — Telehealth: Payer: Self-pay | Admitting: Clinical

## 2015-03-24 NOTE — Telephone Encounter (Signed)
Left message

## 2015-03-29 ENCOUNTER — Ambulatory Visit: Payer: Self-pay

## 2015-04-23 ENCOUNTER — Ambulatory Visit: Payer: Self-pay

## 2017-01-25 ENCOUNTER — Emergency Department (HOSPITAL_COMMUNITY)
Admission: EM | Admit: 2017-01-25 | Discharge: 2017-01-25 | Disposition: A | Discharge status: Home / Self Care | Payer: Self-pay | Attending: Emergency Medicine | Admitting: Emergency Medicine

## 2017-01-25 ENCOUNTER — Emergency Department (HOSPITAL_COMMUNITY): Payer: Self-pay

## 2017-01-25 ENCOUNTER — Encounter (HOSPITAL_COMMUNITY): Payer: Self-pay | Admitting: Emergency Medicine

## 2017-01-25 DIAGNOSIS — K2921 Alcoholic gastritis with bleeding: Secondary | ICD-10-CM | POA: Insufficient documentation

## 2017-01-25 DIAGNOSIS — F17200 Nicotine dependence, unspecified, uncomplicated: Secondary | ICD-10-CM | POA: Insufficient documentation

## 2017-01-25 DIAGNOSIS — R1010 Upper abdominal pain, unspecified: Secondary | ICD-10-CM

## 2017-01-25 DIAGNOSIS — K852 Alcohol induced acute pancreatitis without necrosis or infection: Secondary | ICD-10-CM | POA: Insufficient documentation

## 2017-01-25 LAB — COMPREHENSIVE METABOLIC PANEL
ALK PHOS: 73 U/L (ref 38–126)
ALT: 22 U/L (ref 14–54)
AST: 27 U/L (ref 15–41)
Albumin: 3.7 g/dL (ref 3.5–5.0)
Anion gap: 11 (ref 5–15)
BUN: 6 mg/dL (ref 6–20)
CHLORIDE: 104 mmol/L (ref 101–111)
CO2: 20 mmol/L — ABNORMAL LOW (ref 22–32)
Calcium: 9 mg/dL (ref 8.9–10.3)
Creatinine, Ser: 0.96 mg/dL (ref 0.44–1.00)
GFR calc non Af Amer: >60 mL/min (ref >60)
GLUCOSE: 91 mg/dL (ref 65–99)
POTASSIUM: 4 mmol/L (ref 3.5–5.1)
Sodium: 135 mmol/L (ref 135–145)
Total Bilirubin: 0.9 mg/dL (ref 0.3–1.2)
Total Protein: 7.3 g/dL (ref 6.5–8.1)

## 2017-01-25 LAB — CBC
HEMATOCRIT: 42.6 % (ref 36.0–46.0)
Hemoglobin: 14.8 g/dL (ref 12.0–15.0)
MCH: 34 pg (ref 26.0–34.0)
MCHC: 34.7 g/dL (ref 30.0–36.0)
MCV: 97.9 fL (ref 78.0–100.0)
Platelets: 221 10*3/uL (ref 150–400)
RBC: 4.35 MIL/uL (ref 3.87–5.11)
RDW: 13.7 % (ref 11.5–15.5)
WBC: 9.9 10*3/uL (ref 4.0–10.5)

## 2017-01-25 LAB — D-DIMER, QUANTITATIVE: D-Dimer, Quant: 0.56 ug/mL-FEU — ABNORMAL HIGH (ref 0.00–0.50)

## 2017-01-25 LAB — URINALYSIS, ROUTINE W REFLEX MICROSCOPIC
BILIRUBIN URINE: NEGATIVE
Glucose, UA: NEGATIVE mg/dL
HGB URINE DIPSTICK: NEGATIVE
Ketones, ur: 80 mg/dL — AB
Leukocytes, UA: NEGATIVE
Nitrite: NEGATIVE
PH: 5 (ref 5.0–8.0)
Protein, ur: NEGATIVE mg/dL
SPECIFIC GRAVITY, URINE: 1.025 (ref 1.005–1.030)

## 2017-01-25 LAB — LIPASE, BLOOD: Lipase: 216 U/L — ABNORMAL HIGH (ref 11–51)

## 2017-01-25 LAB — POC URINE PREG, ED: PREG TEST UR: NEGATIVE

## 2017-01-25 LAB — POC OCCULT BLOOD, ED: Fecal Occult Bld: POSITIVE — AB

## 2017-01-25 MED ORDER — PANTOPRAZOLE SODIUM 40 MG PO TBEC: 40.0000 mg | DELAYED_RELEASE_TABLET | Freq: Two times a day (BID) | ORAL | 0 refills | Status: DC

## 2017-01-25 MED ORDER — IOPAMIDOL (ISOVUE-300) INJECTION 61%
30.0000 mL | Freq: Once | INTRAVENOUS | Status: AC | PRN
  Administered 2017-01-25: 30 mL via ORAL

## 2017-01-25 MED ORDER — HYDROCODONE-ACETAMINOPHEN 5-325 MG PO TABS: 1.0000 | ORAL_TABLET | ORAL | 0 refills | Status: DC | PRN

## 2017-01-25 MED ORDER — PANTOPRAZOLE SODIUM 40 MG IV SOLR
40.0000 mg | Freq: Once | INTRAVENOUS | Status: DC
  Filled 2017-01-25: qty 40

## 2017-01-25 MED ORDER — KETOROLAC TROMETHAMINE 30 MG/ML IJ SOLN
30.0000 mg | Freq: Once | INTRAMUSCULAR | Status: AC
  Administered 2017-01-25: 30 mg via INTRAVENOUS
  Filled 2017-01-25: qty 1

## 2017-01-25 MED ORDER — ONDANSETRON HCL 4 MG/2ML IJ SOLN
4.0000 mg | Freq: Once | INTRAMUSCULAR | Status: AC
  Administered 2017-01-25: 4 mg via INTRAVENOUS
  Filled 2017-01-25: qty 2

## 2017-01-25 MED ORDER — SODIUM CHLORIDE 0.9 % IV BOLUS (SEPSIS)
1000.0000 mL | Freq: Once | INTRAVENOUS | Status: AC
  Administered 2017-01-25: 1000 mL via INTRAVENOUS

## 2017-01-25 MED ORDER — HYDROMORPHONE HCL 1 MG/ML IJ SOLN
1.0000 mg | Freq: Once | INTRAMUSCULAR | Status: AC
  Administered 2017-01-25: 1 mg via INTRAVENOUS
  Filled 2017-01-25: qty 1

## 2017-01-25 MED ORDER — IOPAMIDOL (ISOVUE-370) INJECTION 76%
INTRAVENOUS | Status: AC
  Filled 2017-01-25: qty 100

## 2017-01-25 MED ORDER — PANTOPRAZOLE SODIUM 40 MG PO TBEC
40.0000 mg | DELAYED_RELEASE_TABLET | Freq: Once | ORAL | Status: AC
  Administered 2017-01-25: 40 mg via ORAL
  Filled 2017-01-25: qty 1

## 2017-01-25 MED ORDER — FAMOTIDINE 20 MG PO TABS: 20.0000 mg | ORAL_TABLET | Freq: Two times a day (BID) | ORAL | 0 refills | Status: DC

## 2017-01-25 MED ORDER — IOPAMIDOL (ISOVUE-370) INJECTION 76%
100.0000 mL | Freq: Once | INTRAVENOUS | Status: AC | PRN
  Administered 2017-01-25: 100 mL via INTRAVENOUS

## 2017-01-25 MED ORDER — IOPAMIDOL (ISOVUE-300) INJECTION 61%
INTRAVENOUS | Status: AC
  Administered 2017-01-25: 30 mL via ORAL
  Filled 2017-01-25: qty 30

## 2017-01-25 NOTE — ED Notes (Signed)
Patient is A & O x4.  Patient understood discharge instructions without any questions.

## 2017-01-25 NOTE — ED Triage Notes (Signed)
Patient is complaining of epigastric pain x 3 days which radiates to right side of back.  Denies any trauma or injury to back.  Patient states she was exposed to someone with recent sickness (n/v) in the past couple weeks.    Patient states she was nausea this morning.

## 2017-01-25 NOTE — ED Provider Notes (Signed)
[facility]-EMERGENCY DEPT Provider Note   CSN: [id number] Arrival date & time: 01/25/17  0946     History   Chief Complaint Chief Complaint  Patient presents with  . Abdominal Pain  . Back Pain    HPI Kylie Irwin is a 46 y.o. female.  HPI  46 year old female presents with right-sided back pain and upper abdominal pain. She states started a little over 2 days ago. Pain started in her right mid back. Now radiates into the upper abdomen. She has tried Tylenol with no relief. She had some vomiting/gagging this morning. She feels like she's had dark stool over the last 2 or 3 days. No fevers. No cough or hemoptysis. When she vomited this morning she had a little bit of red that she thinks was from the TheraFlu she took this morning. She's been having right lower chest pain as well. She does not feel short of breath but the pain worsens with inspiration. She has also had dark stools that she states look almost black over the last 3 days. No urinary or vaginal symptoms.  History reviewed. No pertinent past medical history.  Patient Active Problem List   Diagnosis Date Noted  . Liver lesion, left lobe 03/02/2015  . Tobacco use disorder 03/02/2015  . Constipation 03/02/2015  . Hypokalemia 03/02/2015    Past Surgical History:  Procedure Laterality Date  . CESAREAN SECTION      OB History    No data available       Home Medications    Prior to Admission medications   Medication Sig Start Date End Date Taking? Authorizing Provider  acetaminophen (TYLENOL) 500 MG tablet Take 1,000 mg by mouth every 6 (six) hours as needed for mild pain, moderate pain, fever or headache.   Yes Historical Provider, MD  DM-Phenylephrine-Acetaminophen (THERAFLU EXPRESSMAX SEV CLD/CG) 10-5-325 MG TABS Take 1 tablet by mouth 2 (two) times daily as needed (for flu-like symptoms).   Yes Historical Provider, MD  famotidine (PEPCID) 20 MG tablet Take 1 tablet (20 mg total) by mouth 2 (two) times daily.  01/25/17   Pricilla Loveless, MD  HYDROcodone-acetaminophen (NORCO) 5-325 MG tablet Take 1 tablet by mouth every 4 (four) hours as needed for severe pain. 01/25/17   Pricilla Loveless, MD  pantoprazole (PROTONIX) 40 MG tablet Take 1 tablet (40 mg total) by mouth 2 (two) times daily. 01/25/17   Pricilla Loveless, MD    Family History No family history on file.  Social History Social History  Substance Use Topics  . Smoking status: Current Every Day Smoker  . Smokeless tobacco: Never Used  . Alcohol use Yes     Comment: occassional     Allergies   Shrimp [shellfish allergy]   Review of Systems Review of Systems  Constitutional: Negative for fever.  Respiratory: Negative for cough and shortness of breath.   Cardiovascular: Positive for chest pain.  Gastrointestinal: Positive for abdominal pain, nausea and vomiting. Negative for diarrhea.  Genitourinary: Negative for dysuria, vaginal bleeding, vaginal discharge and vaginal pain.  Musculoskeletal: Positive for back pain.  All other systems reviewed and are negative.    Physical Exam Updated Vital Signs BP (!) 171/116   Pulse 68   Temp 97.8 F (36.6 C) (Oral)   Resp (!) 23   Ht  (1.651 m)   Wt 145 lb (65.8 kg)   LMP 01/11/2017   SpO2 99%   BMI 24.13 kg/m   Physical Exam  Constitutional: She is oriented to person, place,  and time. She appears well-developed and well-nourished.  HENT:  Head: Normocephalic and atraumatic.  Right Ear: External ear normal.  Left Ear: External ear normal.  Nose: Nose normal.  Eyes: Right eye exhibits no discharge. Left eye exhibits no discharge.  Cardiovascular: Normal rate, regular rhythm and normal heart sounds.   Pulmonary/Chest: Effort normal and breath sounds normal.     She exhibits tenderness.    Tender over lower ribs anteriorly and posteriorly on right side  Abdominal: Soft. There is tenderness in the right upper quadrant, epigastric area and left upper quadrant.  Neurological:  She is alert and oriented to person, place, and time.  Skin: Skin is warm and dry. She is not diaphoretic.  Nursing note and vitals reviewed.    ED Treatments / Results  Labs (all labs ordered are listed, but only abnormal results are displayed) Labs Reviewed  LIPASE, BLOOD - Abnormal; Notable for the following:       Result Value   Lipase 216 (*)    All other components within normal limits  COMPREHENSIVE METABOLIC PANEL - Abnormal; Notable for the following:    CO2 20 (*)    All other components within normal limits  URINALYSIS, ROUTINE W REFLEX MICROSCOPIC - Abnormal; Notable for the following:    Ketones, ur 80 (*)    All other components within normal limits  D-DIMER, QUANTITATIVE (NOT AT [facility]) - Abnormal; Notable for the following:    D-Dimer, Quant 0.56 (*)    All other components within normal limits  POC OCCULT BLOOD, ED - Abnormal; Notable for the following:    Fecal Occult Bld POSITIVE (*)    All other components within normal limits  CBC  POC URINE PREG, ED    EKG  EKG Interpretation  Date/Time:  Thursday January 25 2017 10:37:26 EDT Ventricular Rate:  65 PR Interval:    QRS Duration: 88 QT Interval:  443 QTC Calculation: 461 R Axis:   -27 Text Interpretation:  Sinus rhythm Borderline left axis deviation no acute ST/T changes No old tracing to compare Confirmed by Nakenya Theall MD, Casilda Pickerill [phone]) on 01/25/2017 10:53:19 AM       Radiology Dg Chest 2 View  Result Date: 01/25/2017 CLINICAL DATA:  Right lower chest pain EXAM: CHEST  2 VIEW COMPARISON:  None. FINDINGS: The heart size and mediastinal contours are within normal limits. Both lungs are clear. The visualized skeletal structures are unremarkable. IMPRESSION: No active cardiopulmonary disease. Electronically Signed   By: Marlan Palau M.D.   On: 01/25/2017 11:32   Ct Angio Chest Pe W/cm &/or Wo Cm  Result Date: 01/25/2017 CLINICAL DATA:  Patient is complaining of epigastric pain x 3 days which radiates to  right side of back. Denies any trauma or injury to back. Patient states she was exposed to someone with recent sickness (n/v) in the past couple weeks. EXAM: CT ANGIOGRAPHY CHEST CT ABDOMEN AND PELVIS WITH CONTRAST TECHNIQUE: Multidetector CT imaging of the chest was performed using the standard protocol during bolus administration of intravenous contrast. Multiplanar CT image reconstructions and MIPs were obtained to evaluate the vascular anatomy. Multidetector CT imaging of the abdomen and pelvis was performed using the standard protocol during bolus administration of intravenous contrast. COMPLICATIONS: TECHNOLOGIST REPORTS: PT'S EXISTING 20 G IV DIFFUSELY INFILTRATED DURING CT ANGIO CHEST--IV REMOVED, APPLIED COLD COMPRESS, PT WITH MINIMAL DISCOMFORT USED PT'S OTHER EXISTING 22G WITH DECREASED INJECTION RATE WITHOUT COMPLICATIONS PT'S RN LESLIE NOTIFIED IN ED CONTRAST:  100 mL Isovue 370 COMPARISON:  None. FINDINGS: CTA CHEST FINDINGS Cardiovascular: Satisfactory opacification of the pulmonary arteries to the segmental level. No evidence of pulmonary embolism. Normal heart size. No pericardial effusion. Normal caliber thoracic aorta. No thoracic aortic aneurysm. No thoracic aortic dissection. Mediastinum/Nodes: No enlarged mediastinal, hilar, or axillary lymph nodes. Thyroid gland, trachea, and esophagus demonstrate no significant findings. Lungs/Pleura: Lungs are clear. No pleural effusion or pneumothorax. Musculoskeletal: No chest wall abnormality. No acute or significant osseous findings. Review of the MIP images confirms the above findings. CT ABDOMEN and PELVIS FINDINGS Hepatobiliary: 14 mm hypodense, fluid attenuating right hepatic mass with thin internal septation most consistent with a cyst. Liver is otherwise normal. Normal gallbladder. Pancreas: Pancreas enhances normally. Severe peripancreatic inflammatory changes throughout the pancreas. No focal fluid collection. Spleen: Normal in size without focal  abnormality. Adrenals/Urinary Tract: Adrenal glands are unremarkable. Kidneys are normal, without renal calculi, focal lesion, or hydronephrosis. Bladder is unremarkable. Stomach/Bowel: Stomach is within normal limits. Appendix appears normal. No evidence of bowel wall thickening, distention, or inflammatory changes. Vascular/Lymphatic: No significant vascular findings are present. No enlarged abdominal or pelvic lymph nodes. Reproductive: No adnexal mass. Small right lower uterine mass measuring 14 mm consistent with a fibroid. 2.2 cm uterine fundal mass most consistent with a fibroid. Other: Small amount of abdominal and pelvic free fluid. No abdominal wall hernia. Musculoskeletal: No acute osseous abnormality. No lytic or sclerotic osseous lesion. Review of the MIP images confirms the above findings. IMPRESSION: 1. No with pulmonary embolus. 2. No aortic dissection or aortic aneurysm. 3. Findings most consistent with acute pancreatitis. No pancreatic necrosis. No focal fluid collection to suggest a pseudocyst or abscess. Electronically Signed   By: Elige Ko   On: 01/25/2017 14:46   Ct Abdomen Pelvis W Contrast  Result Date: 01/25/2017 CLINICAL DATA:  Patient is complaining of epigastric pain x 3 days which radiates to right side of back. Denies any trauma or injury to back. Patient states she was exposed to someone with recent sickness (n/v) in the past couple weeks. EXAM: CT ANGIOGRAPHY CHEST CT ABDOMEN AND PELVIS WITH CONTRAST TECHNIQUE: Multidetector CT imaging of the chest was performed using the standard protocol during bolus administration of intravenous contrast. Multiplanar CT image reconstructions and MIPs were obtained to evaluate the vascular anatomy. Multidetector CT imaging of the abdomen and pelvis was performed using the standard protocol during bolus administration of intravenous contrast. COMPLICATIONS: TECHNOLOGIST REPORTS: PT'S EXISTING 20 G IV DIFFUSELY INFILTRATED DURING CT ANGIO  CHEST--IV REMOVED, APPLIED COLD COMPRESS, PT WITH MINIMAL DISCOMFORT USED PT'S OTHER EXISTING 22G WITH DECREASED INJECTION RATE WITHOUT COMPLICATIONS PT'S RN LESLIE NOTIFIED IN ED CONTRAST:  100 mL Isovue 370 COMPARISON:  None. FINDINGS: CTA CHEST FINDINGS Cardiovascular: Satisfactory opacification of the pulmonary arteries to the segmental level. No evidence of pulmonary embolism. Normal heart size. No pericardial effusion. Normal caliber thoracic aorta. No thoracic aortic aneurysm. No thoracic aortic dissection. Mediastinum/Nodes: No enlarged mediastinal, hilar, or axillary lymph nodes. Thyroid gland, trachea, and esophagus demonstrate no significant findings. Lungs/Pleura: Lungs are clear. No pleural effusion or pneumothorax. Musculoskeletal: No chest wall abnormality. No acute or significant osseous findings. Review of the MIP images confirms the above findings. CT ABDOMEN and PELVIS FINDINGS Hepatobiliary: 14 mm hypodense, fluid attenuating right hepatic mass with thin internal septation most consistent with a cyst. Liver is otherwise normal. Normal gallbladder. Pancreas: Pancreas enhances normally. Severe peripancreatic inflammatory changes throughout the pancreas. No focal fluid collection. Spleen: Normal in size without focal abnormality. Adrenals/Urinary Tract: Adrenal glands are  unremarkable. Kidneys are normal, without renal calculi, focal lesion, or hydronephrosis. Bladder is unremarkable. Stomach/Bowel: Stomach is within normal limits. Appendix appears normal. No evidence of bowel wall thickening, distention, or inflammatory changes. Vascular/Lymphatic: No significant vascular findings are present. No enlarged abdominal or pelvic lymph nodes. Reproductive: No adnexal mass. Small right lower uterine mass measuring 14 mm consistent with a fibroid. 2.2 cm uterine fundal mass most consistent with a fibroid. Other: Small amount of abdominal and pelvic free fluid. No abdominal wall hernia. Musculoskeletal: No  acute osseous abnormality. No lytic or sclerotic osseous lesion. Review of the MIP images confirms the above findings. IMPRESSION: 1. No with pulmonary embolus. 2. No aortic dissection or aortic aneurysm. 3. Findings most consistent with acute pancreatitis. No pancreatic necrosis. No focal fluid collection to suggest a pseudocyst or abscess. Electronically Signed   By: Elige Ko   On: 01/25/2017 14:46   US Abdomen Limited Ruq  Result Date: 01/25/2017 CLINICAL DATA:  Patient with right upper quadrant abdominal pain for 2 days. EXAM: US ABDOMEN LIMITED - RIGHT UPPER QUADRANT COMPARISON:  Abdominal ultrasound 02/22/2015. FINDINGS: Gallbladder: No cholelithiasis. No gallbladder wall thickening or pericholecystic fluid. Negative sonographic Murphy's sign. Small 2 mm polyp. Common bile duct: Diameter: 3.7 mm Liver: Within the right hepatic lobe there is a 2.3 x 1.6 x 1.1 cm probable complicated cystic lesion. Within the left hepatic lobe there is a 2.9 x 2.4 x 2.9 cm mildly hyperechoic lesion, indeterminate. IMPRESSION: No cholelithiasis or sonographic evidence for acute cholecystitis. Small gallbladder polyp. Hyperechoic mass within the left hepatic lobe is re- demonstrated and is nonspecific in etiology. Recommend dedicated evaluation with pre and post contrast-enhanced MRI in the non acute setting. Electronically Signed   By: Annia Belt M.D.   On: 01/25/2017 11:25    Procedures Procedures (including critical care time)  Medications Ordered in ED Medications  iopamidol (ISOVUE-370) 76 % injection (not administered)  iopamidol (ISOVUE-370) 76 % injection (not administered)  sodium chloride 0.9 % bolus 1,000 mL (0 mLs Intravenous Stopped 01/25/17 1539)  HYDROmorphone (DILAUDID) injection 1 mg (1 mg Intravenous Given 01/25/17 1045)  ondansetron (ZOFRAN) injection 4 mg (4 mg Intravenous Given 01/25/17 1045)  sodium chloride 0.9 % bolus 1,000 mL (0 mLs Intravenous Stopped 01/25/17 1539)  ketorolac (TORADOL) 30  MG/ML injection 30 mg (30 mg Intravenous Given 01/25/17 1211)  iopamidol (ISOVUE-300) 61 % injection 30 mL (30 mLs Oral Contrast Given 01/25/17 1228)  iopamidol (ISOVUE-370) 76 % injection 100 mL (100 mLs Intravenous Contrast Given 01/25/17 1344)  pantoprazole (PROTONIX) EC tablet 40 mg (40 mg Oral Given 01/25/17 1538)     Initial Impression / Assessment and Plan / ED Course  I have reviewed the triage vital signs and the nursing notes.  Pertinent labs & imaging results that were available during my care of the patient were reviewed by me and considered in my medical decision making (see chart for details).  Clinical Course as of Jan 25 1818  Thu Jan 25, 2017  1025 Patient with lower chest/upper abdominal pain. Most likely this is gastric or gallbladder related given location. However given pleuritic pain will also evaluate for chest pathology. Low risk for PE but does have tachycardia on arrival and with pleuritic pain.  [SG]  1146 Feels somewhat better. Given area of pain, will get both CTA of chest to r/o PE and CT of abd to further eval abd pain.  [SG]    Clinical Course User Index [SG] Pricilla Loveless, MD  Workup is consistent with mild pancreatitis. On further discussion with patient, she notes she drank heavily a couple days ago. Does not drink every day. On rectal exam there is mildly pink stool. Pain significantly improved, now mild. I discussed case with Dr Evette Cristal. He feels this represents pancreatitis and likely alcoholic gastritis. Given well appearance, only mild pain, and normal Hgb (14), can d/c with PPI, clear liquid diet x 2 days. Strict return precautions. No vomiting or stool in ED. Discussed importance of GI f/u as well as strict return precautions.   Final Clinical Impressions(s) / ED Diagnoses   Final diagnoses:  Upper abdominal pain  Alcohol-induced acute pancreatitis without infection or necrosis  Acute alcoholic gastritis with hemorrhage    New Prescriptions Discharge  Medication List as of 01/25/2017  3:16 PM    START taking these medications   Details  famotidine (PEPCID) 20 MG tablet Take 1 tablet (20 mg total) by mouth 2 (two) times daily., Starting Thu 01/25/2017, Print    HYDROcodone-acetaminophen (NORCO) 5-325 MG tablet Take 1 tablet by mouth every 4 (four) hours as needed for severe pain., Starting Thu 01/25/2017, Print    pantoprazole (PROTONIX) 40 MG tablet Take 1 tablet (40 mg total) by mouth 2 (two) times daily., Starting Thu 01/25/2017, Print         Pricilla Loveless, MD 01/25/17 [phone]

## 2017-01-26 ENCOUNTER — Encounter (HOSPITAL_COMMUNITY): Payer: Self-pay | Admitting: Emergency Medicine

## 2017-01-26 ENCOUNTER — Inpatient Hospital Stay (HOSPITAL_COMMUNITY)
Admission: EM | Admit: 2017-01-26 | Discharge: 2017-01-29 | DRG: 440 | Disposition: A | Discharge status: Home / Self Care | Payer: Self-pay | Attending: Internal Medicine | Admitting: Internal Medicine

## 2017-01-26 DIAGNOSIS — K7689 Other specified diseases of liver: Secondary | ICD-10-CM | POA: Diagnosis present

## 2017-01-26 DIAGNOSIS — K859 Acute pancreatitis without necrosis or infection, unspecified: Secondary | ICD-10-CM | POA: Diagnosis present

## 2017-01-26 DIAGNOSIS — K769 Liver disease, unspecified: Secondary | ICD-10-CM | POA: Diagnosis present

## 2017-01-26 DIAGNOSIS — K219 Gastro-esophageal reflux disease without esophagitis: Secondary | ICD-10-CM | POA: Diagnosis present

## 2017-01-26 DIAGNOSIS — F1010 Alcohol abuse, uncomplicated: Secondary | ICD-10-CM | POA: Diagnosis present

## 2017-01-26 DIAGNOSIS — F17200 Nicotine dependence, unspecified, uncomplicated: Secondary | ICD-10-CM | POA: Diagnosis present

## 2017-01-26 DIAGNOSIS — K5900 Constipation, unspecified: Secondary | ICD-10-CM | POA: Diagnosis present

## 2017-01-26 DIAGNOSIS — E860 Dehydration: Secondary | ICD-10-CM | POA: Diagnosis present

## 2017-01-26 DIAGNOSIS — I10 Essential (primary) hypertension: Secondary | ICD-10-CM | POA: Diagnosis present

## 2017-01-26 DIAGNOSIS — Z91013 Allergy to seafood: Secondary | ICD-10-CM

## 2017-01-26 DIAGNOSIS — K852 Alcohol induced acute pancreatitis without necrosis or infection: Principal | ICD-10-CM | POA: Diagnosis present

## 2017-01-26 DIAGNOSIS — K2970 Gastritis, unspecified, without bleeding: Secondary | ICD-10-CM | POA: Diagnosis present

## 2017-01-26 DIAGNOSIS — Z79899 Other long term (current) drug therapy: Secondary | ICD-10-CM

## 2017-01-26 DIAGNOSIS — F1210 Cannabis abuse, uncomplicated: Secondary | ICD-10-CM

## 2017-01-26 DIAGNOSIS — F17210 Nicotine dependence, cigarettes, uncomplicated: Secondary | ICD-10-CM | POA: Diagnosis present

## 2017-01-26 HISTORY — DX: Acute pancreatitis without necrosis or infection, unspecified: K85.90

## 2017-01-26 LAB — COMPREHENSIVE METABOLIC PANEL
ALBUMIN: 3.7 g/dL (ref 3.5–5.0)
ALK PHOS: 67 U/L (ref 38–126)
ALT: 19 U/L (ref 14–54)
AST: 19 U/L (ref 15–41)
Anion gap: 12 (ref 5–15)
BILIRUBIN TOTAL: 0.9 mg/dL (ref 0.3–1.2)
BUN: 5 mg/dL — AB (ref 6–20)
CALCIUM: 8.7 mg/dL — AB (ref 8.9–10.3)
CO2: 16 mmol/L — ABNORMAL LOW (ref 22–32)
CREATININE: 0.8 mg/dL (ref 0.44–1.00)
Chloride: 104 mmol/L (ref 101–111)
GFR calc Af Amer: >60 mL/min (ref >60)
GLUCOSE: 90 mg/dL (ref 65–99)
Potassium: 3.6 mmol/L (ref 3.5–5.1)
Sodium: 132 mmol/L — ABNORMAL LOW (ref 135–145)
TOTAL PROTEIN: 7.2 g/dL (ref 6.5–8.1)

## 2017-01-26 LAB — CBC WITH DIFFERENTIAL/PLATELET
BASOS ABS: 0 10*3/uL (ref 0.0–0.1)
Basophils Relative: 0 %
EOS PCT: 0 %
Eosinophils Absolute: 0 10*3/uL (ref 0.0–0.7)
HEMATOCRIT: 43.1 % (ref 36.0–46.0)
Hemoglobin: 15 g/dL (ref 12.0–15.0)
LYMPHS ABS: 1.7 10*3/uL (ref 0.7–4.0)
LYMPHS PCT: 15 %
MCH: 34.2 pg — AB (ref 26.0–34.0)
MCHC: 34.8 g/dL (ref 30.0–36.0)
MCV: 98.4 fL (ref 78.0–100.0)
MONO ABS: 0.5 10*3/uL (ref 0.1–1.0)
Monocytes Relative: 4 %
NEUTROS ABS: 9.4 10*3/uL — AB (ref 1.7–7.7)
Neutrophils Relative %: 81 %
Platelets: 213 10*3/uL (ref 150–400)
RBC: 4.38 MIL/uL (ref 3.87–5.11)
RDW: 13.6 % (ref 11.5–15.5)
WBC: 11.7 10*3/uL — ABNORMAL HIGH (ref 4.0–10.5)

## 2017-01-26 LAB — URINALYSIS, ROUTINE W REFLEX MICROSCOPIC
Bacteria, UA: NONE SEEN
Bilirubin Urine: NEGATIVE
GLUCOSE, UA: NEGATIVE mg/dL
Ketones, ur: 80 mg/dL — AB
Leukocytes, UA: NEGATIVE
Nitrite: NEGATIVE
PROTEIN: 30 mg/dL — AB
Specific Gravity, Urine: >1.046 — ABNORMAL HIGH (ref 1.005–1.030)
pH: 5 (ref 5.0–8.0)

## 2017-01-26 LAB — PREGNANCY, URINE: Preg Test, Ur: NEGATIVE

## 2017-01-26 LAB — TRIGLYCERIDES: TRIGLYCERIDES: 82 mg/dL (ref <150)

## 2017-01-26 LAB — LIPASE, BLOOD: Lipase: 1149 U/L — ABNORMAL HIGH (ref 11–51)

## 2017-01-26 MED ORDER — FENTANYL CITRATE (PF) 100 MCG/2ML IJ SOLN
25.0000 ug | INTRAMUSCULAR | Status: DC | PRN
  Administered 2017-01-26 – 2017-01-29 (×8): 25 ug via INTRAVENOUS
  Filled 2017-01-26 (×8): qty 2

## 2017-01-26 MED ORDER — FOLIC ACID 1 MG PO TABS
1.0000 mg | ORAL_TABLET | Freq: Every day | ORAL | Status: DC
  Administered 2017-01-26 – 2017-01-29 (×4): 1 mg via ORAL
  Filled 2017-01-26 (×4): qty 1

## 2017-01-26 MED ORDER — KCL IN DEXTROSE-NACL 20-5-0.9 MEQ/L-%-% IV SOLN
INTRAVENOUS | Status: DC
  Administered 2017-01-26 – 2017-01-27 (×3): via INTRAVENOUS
  Filled 2017-01-26 (×7): qty 1000

## 2017-01-26 MED ORDER — ORAL CARE MOUTH RINSE
15.0000 mL | Freq: Two times a day (BID) | OROMUCOSAL | Status: DC
  Administered 2017-01-26 – 2017-01-29 (×4): 15 mL via OROMUCOSAL

## 2017-01-26 MED ORDER — LORAZEPAM 2 MG/ML IJ SOLN
0.0000 mg | Freq: Four times a day (QID) | INTRAMUSCULAR | Status: DC
Start: 2017-01-26 — End: 2017-01-26

## 2017-01-26 MED ORDER — PANTOPRAZOLE SODIUM 40 MG IV SOLR
40.0000 mg | Freq: Two times a day (BID) | INTRAVENOUS | Status: DC
  Administered 2017-01-26 (×2): 40 mg via INTRAVENOUS
  Filled 2017-01-26 (×2): qty 40

## 2017-01-26 MED ORDER — SODIUM CHLORIDE 0.9 % IV BOLUS (SEPSIS)
1000.0000 mL | Freq: Once | INTRAVENOUS | Status: AC
  Administered 2017-01-26: 1000 mL via INTRAVENOUS

## 2017-01-26 MED ORDER — THIAMINE HCL 100 MG/ML IJ SOLN
100.0000 mg | Freq: Every day | INTRAMUSCULAR | Status: DC
  Filled 2017-01-26: qty 2

## 2017-01-26 MED ORDER — FENTANYL CITRATE (PF) 100 MCG/2ML IJ SOLN
100.0000 ug | Freq: Once | INTRAMUSCULAR | Status: AC
  Administered 2017-01-26: 100 ug via INTRAVENOUS
  Filled 2017-01-26: qty 2

## 2017-01-26 MED ORDER — ACETAMINOPHEN 325 MG PO TABS
650.0000 mg | ORAL_TABLET | Freq: Four times a day (QID) | ORAL | Status: DC | PRN
  Administered 2017-01-28: 650 mg via ORAL
  Filled 2017-01-26: qty 2

## 2017-01-26 MED ORDER — ONDANSETRON HCL 4 MG PO TABS
4.0000 mg | ORAL_TABLET | Freq: Four times a day (QID) | ORAL | Status: DC
  Administered 2017-01-26 (×2): 4 mg via ORAL
  Filled 2017-01-26: qty 1

## 2017-01-26 MED ORDER — VITAMIN B-1 100 MG PO TABS: 100.0000 mg | ORAL_TABLET | Freq: Every day | ORAL | Status: DC

## 2017-01-26 MED ORDER — LORAZEPAM 2 MG/ML IJ SOLN: 0.0000 mg | Freq: Two times a day (BID) | INTRAMUSCULAR | Status: DC

## 2017-01-26 MED ORDER — ONDANSETRON HCL 4 MG/2ML IJ SOLN
4.0000 mg | Freq: Once | INTRAMUSCULAR | Status: AC
  Administered 2017-01-26: 4 mg via INTRAVENOUS
  Filled 2017-01-26: qty 2

## 2017-01-26 MED ORDER — ADULT MULTIVITAMIN W/MINERALS CH
1.0000 | ORAL_TABLET | Freq: Every day | ORAL | Status: DC
  Administered 2017-01-26 – 2017-01-29 (×4): 1 via ORAL
  Filled 2017-01-26 (×4): qty 1

## 2017-01-26 MED ORDER — LORAZEPAM 1 MG PO TABS: 1.0000 mg | ORAL_TABLET | Freq: Four times a day (QID) | ORAL | Status: AC | PRN

## 2017-01-26 MED ORDER — BISACODYL 10 MG RE SUPP
10.0000 mg | Freq: Once | RECTAL | Status: AC
  Administered 2017-01-26: 10 mg via RECTAL
  Filled 2017-01-26: qty 1

## 2017-01-26 MED ORDER — LIP MEDEX EX OINT
TOPICAL_OINTMENT | CUTANEOUS | Status: AC
  Administered 2017-01-26: 12:00:00
  Filled 2017-01-26: qty 7

## 2017-01-26 MED ORDER — NICOTINE 14 MG/24HR TD PT24
14.0000 mg | MEDICATED_PATCH | Freq: Every day | TRANSDERMAL | Status: DC
  Administered 2017-01-26 – 2017-01-29 (×4): 14 mg via TRANSDERMAL
  Filled 2017-01-26 (×4): qty 1

## 2017-01-26 MED ORDER — LORAZEPAM 2 MG/ML IJ SOLN: 1.0000 mg | Freq: Four times a day (QID) | INTRAMUSCULAR | Status: AC | PRN

## 2017-01-26 MED ORDER — THIAMINE HCL 100 MG/ML IJ SOLN
100.0000 mg | Freq: Every day | INTRAMUSCULAR | Status: DC
  Administered 2017-01-26: 100 mg via INTRAVENOUS
  Filled 2017-01-26: qty 2

## 2017-01-26 MED ORDER — FOLIC ACID 5 MG/ML IJ SOLN
1.0000 mg | Freq: Every day | INTRAMUSCULAR | Status: DC
  Filled 2017-01-26: qty 0.2

## 2017-01-26 MED ORDER — FENTANYL CITRATE (PF) 100 MCG/2ML IJ SOLN
25.0000 ug | INTRAMUSCULAR | Status: DC | PRN
  Administered 2017-01-26: 25 ug via INTRAVENOUS
  Filled 2017-01-26: qty 2

## 2017-01-26 MED ORDER — ACETAMINOPHEN 650 MG RE SUPP: 650.0000 mg | Freq: Four times a day (QID) | RECTAL | Status: DC | PRN

## 2017-01-26 MED ORDER — VITAMIN B-1 100 MG PO TABS
100.0000 mg | ORAL_TABLET | Freq: Every day | ORAL | Status: DC
Start: 2017-01-27 — End: 2017-01-29
  Administered 2017-01-27 – 2017-01-29 (×3): 100 mg via ORAL
  Filled 2017-01-26 (×4): qty 1

## 2017-01-26 MED ORDER — OXYCODONE HCL 5 MG PO TABS
5.0000 mg | ORAL_TABLET | Freq: Four times a day (QID) | ORAL | Status: DC | PRN
  Administered 2017-01-27 – 2017-01-29 (×6): 5 mg via ORAL
  Filled 2017-01-26 (×6): qty 1

## 2017-01-26 MED ORDER — OXYCODONE HCL 5 MG PO TABS
5.0000 mg | ORAL_TABLET | Freq: Four times a day (QID) | ORAL | Status: DC
  Administered 2017-01-26 (×4): 5 mg via ORAL
  Filled 2017-01-26 (×4): qty 1

## 2017-01-26 MED ORDER — CHLORHEXIDINE GLUCONATE 0.12 % MT SOLN
15.0000 mL | Freq: Two times a day (BID) | OROMUCOSAL | Status: DC
  Administered 2017-01-26 – 2017-01-29 (×7): 15 mL via OROMUCOSAL
  Filled 2017-01-26 (×7): qty 15

## 2017-01-26 MED ORDER — PROMETHAZINE HCL 25 MG/ML IJ SOLN
25.0000 mg | Freq: Four times a day (QID) | INTRAMUSCULAR | Status: DC | PRN
  Administered 2017-01-26 – 2017-01-28 (×3): 25 mg via INTRAVENOUS
  Filled 2017-01-26 (×3): qty 1

## 2017-01-26 MED ORDER — ONDANSETRON HCL 4 MG/2ML IJ SOLN
4.0000 mg | Freq: Four times a day (QID) | INTRAMUSCULAR | Status: DC
  Administered 2017-01-26 (×2): 4 mg via INTRAVENOUS
  Filled 2017-01-26 (×2): qty 2

## 2017-01-26 NOTE — Progress Notes (Signed)
rn notified 

## 2017-01-26 NOTE — ED Provider Notes (Addendum)
[facility]-EMERGENCY DEPT Provider Note: Kylie Dell, MD, FACEP  CSN: [id number] MRN: [id number] ARRIVAL: 01/26/17 at 0041 ROOM: WA13/[facility]13   CHIEF COMPLAINT  Abdominal Pain   HISTORY OF PRESENT ILLNESS  Kylie Irwin is a 46 y.o. female with a history of pancreatitis. She was seen yesterday for abdominal pain and diagnosed with pancreatitis and alcoholic gastritis. A CT of the abdomen and pelvis showed pancreatic inflammation without necrosis, pseudocyst or abscess. She was discharged home on hydrocodone, Pepcid and Protonix. Her lipase at that time was 216.  She returns with worsening epigastric and left upper quadrant pain. She is also had associated nausea and vomiting. She states the pain is severe and worse with movement or palpation. It has not been adequately relieved with prescribed medications. Her repeat lipase this morning was noted to be 1149.   Past Medical History:  Diagnosis Date  . Pancreatitis     Past Surgical History:  Procedure Laterality Date  . CESAREAN SECTION      History reviewed. No pertinent family history.  Social History  Substance Use Topics  . Smoking status: Current Every Day Smoker  . Smokeless tobacco: Never Used  . Alcohol use Yes     Comment: occassional    Prior to Admission medications   Medication Sig Start Date End Date Taking? Authorizing Provider  acetaminophen (TYLENOL) 500 MG tablet Take 1,000 mg by mouth every 6 (six) hours as needed for mild pain, moderate pain, fever or headache.   Yes Historical Provider, MD  famotidine (PEPCID) 20 MG tablet Take 1 tablet (20 mg total) by mouth 2 (two) times daily. 01/25/17  Yes Pricilla Loveless, MD  HYDROcodone-acetaminophen (NORCO) 5-325 MG tablet Take 1 tablet by mouth every 4 (four) hours as needed for severe pain. 01/25/17  Yes Pricilla Loveless, MD  pantoprazole (PROTONIX) 40 MG tablet Take 1 tablet (40 mg total) by mouth 2 (two) times daily. 01/25/17  Yes Pricilla Loveless, MD     Allergies Shrimp [shellfish allergy]   REVIEW OF SYSTEMS  Negative except as noted here or in the History of Present Illness.   PHYSICAL EXAMINATION  Initial Vital Signs Blood pressure (!) 151/86, pulse 68, temperature 98.2 F (36.8 C), temperature source Oral, resp. rate 18, height  (1.651 m), weight 150 lb (68 kg), last menstrual period 01/11/2017, SpO2 99 %.  Examination General: Well-developed, well-nourished female in no acute distress; appearance consistent with age of record HENT: normocephalic; atraumatic Eyes: pupils equal, round and reactive to light; extraocular muscles intact Neck: supple Heart: regular rate and rhythm Lungs: clear to auscultation bilaterally Abdomen: soft; nondistended; significant epigastric and left upper quadrant tenderness; no masses or hepatosplenomegaly but exam limited by tenderness; bowel sounds present Extremities: No deformity; full range of motion; pulses normal Neurologic: Awake, alert and oriented; motor function intact in all extremities and symmetric; no facial droop Skin: Warm and dry Psychiatric: Tearful   RESULTS  Summary of this visit's results, reviewed by myself:   EKG Interpretation  Date/Time:    Ventricular Rate:    PR Interval:    QRS Duration:   QT Interval:    QTC Calculation:   R Axis:     Text Interpretation:        Laboratory Studies: Results for orders placed or performed during the hospital encounter of 01/26/17 (from the past 24 hour(s))  Lipase, blood     Status: Abnormal   Collection Time: 01/26/17  1:04 AM  Result Value Ref Range   Lipase  1,149 (H) 11 - 51 U/L  Comprehensive metabolic panel     Status: Abnormal   Collection Time: 01/26/17  1:04 AM  Result Value Ref Range   Sodium 132 (L) 135 - 145 mmol/L   Potassium 3.6 3.5 - 5.1 mmol/L   Chloride 104 101 - 111 mmol/L   CO2 16 (L) 22 - 32 mmol/L   Glucose, Bld 90 65 - 99 mg/dL   BUN 5 (L) 6 - 20 mg/dL   Creatinine, Ser 1.61 0.44 -  1.00 mg/dL   Calcium 8.7 (L) 8.9 - 10.3 mg/dL   Total Protein 7.2 6.5 - 8.1 g/dL   Albumin 3.7 3.5 - 5.0 g/dL   AST 19 15 - 41 U/L   ALT 19 14 - 54 U/L   Alkaline Phosphatase 67 38 - 126 U/L   Total Bilirubin 0.9 0.3 - 1.2 mg/dL   GFR calc non Af Amer >60 >60 mL/min   GFR calc Af Amer >60 >60 mL/min   Anion gap 12 5 - 15  CBC with Differential     Status: Abnormal   Collection Time: 01/26/17  1:04 AM  Result Value Ref Range   WBC 11.7 (H) 4.0 - 10.5 K/uL   RBC 4.38 3.87 - 5.11 MIL/uL   Hemoglobin 15.0 12.0 - 15.0 g/dL   HCT 09.6 04.5 - 40.9 %   MCV 98.4 78.0 - 100.0 fL   MCH 34.2 (H) 26.0 - 34.0 pg   MCHC 34.8 30.0 - 36.0 g/dL   RDW 81.1 91.4 - 78.2 %   Platelets 213 150 - 400 K/uL   Neutrophils Relative % 81 %   Neutro Abs 9.4 (H) 1.7 - 7.7 K/uL   Lymphocytes Relative 15 %   Lymphs Abs 1.7 0.7 - 4.0 K/uL   Monocytes Relative 4 %   Monocytes Absolute 0.5 0.1 - 1.0 K/uL   Eosinophils Relative 0 %   Eosinophils Absolute 0.0 0.0 - 0.7 K/uL   Basophils Relative 0 %   Basophils Absolute 0.0 0.0 - 0.1 K/uL  Urinalysis, Routine w reflex microscopic     Status: Abnormal   Collection Time: 01/26/17  1:52 AM  Result Value Ref Range   Color, Urine YELLOW YELLOW   APPearance CLEAR CLEAR   Specific Gravity, Urine >1.046 (H) 1.005 - 1.030   pH 5.0 5.0 - 8.0   Glucose, UA NEGATIVE NEGATIVE mg/dL   Hgb urine dipstick SMALL (A) NEGATIVE   Bilirubin Urine NEGATIVE NEGATIVE   Ketones, ur 80 (A) NEGATIVE mg/dL   Protein, ur 30 (A) NEGATIVE mg/dL   Nitrite NEGATIVE NEGATIVE   Leukocytes, UA NEGATIVE NEGATIVE   RBC / HPF 0-5 0 - 5 RBC/hpf   WBC, UA 0-5 0 - 5 WBC/hpf   Bacteria, UA NONE SEEN NONE SEEN   Squamous Epithelial / LPF 0-5 (A) NONE SEEN   Imaging Studies: Dg Chest 2 View  Result Date: 01/25/2017 CLINICAL DATA:  Right lower chest pain EXAM: CHEST  2 VIEW COMPARISON:  None. FINDINGS: The heart size and mediastinal contours are within normal limits. Both lungs are clear. The  visualized skeletal structures are unremarkable. IMPRESSION: No active cardiopulmonary disease. Electronically Signed   By: Marlan Palau M.D.   On: 01/25/2017 11:32   Ct Angio Chest Pe W/cm &/or Wo Cm  Result Date: 01/25/2017 CLINICAL DATA:  Patient is complaining of epigastric pain x 3 days which radiates to right side of back. Denies any trauma or injury to back. Patient states  she was exposed to someone with recent sickness (n/v) in the past couple weeks. EXAM: CT ANGIOGRAPHY CHEST CT ABDOMEN AND PELVIS WITH CONTRAST TECHNIQUE: Multidetector CT imaging of the chest was performed using the standard protocol during bolus administration of intravenous contrast. Multiplanar CT image reconstructions and MIPs were obtained to evaluate the vascular anatomy. Multidetector CT imaging of the abdomen and pelvis was performed using the standard protocol during bolus administration of intravenous contrast. COMPLICATIONS: TECHNOLOGIST REPORTS: PT'S EXISTING 20 G IV DIFFUSELY INFILTRATED DURING CT ANGIO CHEST--IV REMOVED, APPLIED COLD COMPRESS, PT WITH MINIMAL DISCOMFORT USED PT'S OTHER EXISTING 22G WITH DECREASED INJECTION RATE WITHOUT COMPLICATIONS PT'S RN LESLIE NOTIFIED IN ED CONTRAST:  100 mL Isovue 370 COMPARISON:  None. FINDINGS: CTA CHEST FINDINGS Cardiovascular: Satisfactory opacification of the pulmonary arteries to the segmental level. No evidence of pulmonary embolism. Normal heart size. No pericardial effusion. Normal caliber thoracic aorta. No thoracic aortic aneurysm. No thoracic aortic dissection. Mediastinum/Nodes: No enlarged mediastinal, hilar, or axillary lymph nodes. Thyroid gland, trachea, and esophagus demonstrate no significant findings. Lungs/Pleura: Lungs are clear. No pleural effusion or pneumothorax. Musculoskeletal: No chest wall abnormality. No acute or significant osseous findings. Review of the MIP images confirms the above findings. CT ABDOMEN and PELVIS FINDINGS Hepatobiliary: 14 mm  hypodense, fluid attenuating right hepatic mass with thin internal septation most consistent with a cyst. Liver is otherwise normal. Normal gallbladder. Pancreas: Pancreas enhances normally. Severe peripancreatic inflammatory changes throughout the pancreas. No focal fluid collection. Spleen: Normal in size without focal abnormality. Adrenals/Urinary Tract: Adrenal glands are unremarkable. Kidneys are normal, without renal calculi, focal lesion, or hydronephrosis. Bladder is unremarkable. Stomach/Bowel: Stomach is within normal limits. Appendix appears normal. No evidence of bowel wall thickening, distention, or inflammatory changes. Vascular/Lymphatic: No significant vascular findings are present. No enlarged abdominal or pelvic lymph nodes. Reproductive: No adnexal mass. Small right lower uterine mass measuring 14 mm consistent with a fibroid. 2.2 cm uterine fundal mass most consistent with a fibroid. Other: Small amount of abdominal and pelvic free fluid. No abdominal wall hernia. Musculoskeletal: No acute osseous abnormality. No lytic or sclerotic osseous lesion. Review of the MIP images confirms the above findings. IMPRESSION: 1. No with pulmonary embolus. 2. No aortic dissection or aortic aneurysm. 3. Findings most consistent with acute pancreatitis. No pancreatic necrosis. No focal fluid collection to suggest a pseudocyst or abscess. Electronically Signed   By: Elige Ko   On: 01/25/2017 14:46   Ct Abdomen Pelvis W Contrast  Result Date: 01/25/2017 CLINICAL DATA:  Patient is complaining of epigastric pain x 3 days which radiates to right side of back. Denies any trauma or injury to back. Patient states she was exposed to someone with recent sickness (n/v) in the past couple weeks. EXAM: CT ANGIOGRAPHY CHEST CT ABDOMEN AND PELVIS WITH CONTRAST TECHNIQUE: Multidetector CT imaging of the chest was performed using the standard protocol during bolus administration of intravenous contrast. Multiplanar CT  image reconstructions and MIPs were obtained to evaluate the vascular anatomy. Multidetector CT imaging of the abdomen and pelvis was performed using the standard protocol during bolus administration of intravenous contrast. COMPLICATIONS: TECHNOLOGIST REPORTS: PT'S EXISTING 20 G IV DIFFUSELY INFILTRATED DURING CT ANGIO CHEST--IV REMOVED, APPLIED COLD COMPRESS, PT WITH MINIMAL DISCOMFORT USED PT'S OTHER EXISTING 22G WITH DECREASED INJECTION RATE WITHOUT COMPLICATIONS PT'S RN LESLIE NOTIFIED IN ED CONTRAST:  100 mL Isovue 370 COMPARISON:  None. FINDINGS: CTA CHEST FINDINGS Cardiovascular: Satisfactory opacification of the pulmonary arteries to the segmental level. No evidence of  pulmonary embolism. Normal heart size. No pericardial effusion. Normal caliber thoracic aorta. No thoracic aortic aneurysm. No thoracic aortic dissection. Mediastinum/Nodes: No enlarged mediastinal, hilar, or axillary lymph nodes. Thyroid gland, trachea, and esophagus demonstrate no significant findings. Lungs/Pleura: Lungs are clear. No pleural effusion or pneumothorax. Musculoskeletal: No chest wall abnormality. No acute or significant osseous findings. Review of the MIP images confirms the above findings. CT ABDOMEN and PELVIS FINDINGS Hepatobiliary: 14 mm hypodense, fluid attenuating right hepatic mass with thin internal septation most consistent with a cyst. Liver is otherwise normal. Normal gallbladder. Pancreas: Pancreas enhances normally. Severe peripancreatic inflammatory changes throughout the pancreas. No focal fluid collection. Spleen: Normal in size without focal abnormality. Adrenals/Urinary Tract: Adrenal glands are unremarkable. Kidneys are normal, without renal calculi, focal lesion, or hydronephrosis. Bladder is unremarkable. Stomach/Bowel: Stomach is within normal limits. Appendix appears normal. No evidence of bowel wall thickening, distention, or inflammatory changes. Vascular/Lymphatic: No significant vascular findings  are present. No enlarged abdominal or pelvic lymph nodes. Reproductive: No adnexal mass. Small right lower uterine mass measuring 14 mm consistent with a fibroid. 2.2 cm uterine fundal mass most consistent with a fibroid. Other: Small amount of abdominal and pelvic free fluid. No abdominal wall hernia. Musculoskeletal: No acute osseous abnormality. No lytic or sclerotic osseous lesion. Review of the MIP images confirms the above findings. IMPRESSION: 1. No with pulmonary embolus. 2. No aortic dissection or aortic aneurysm. 3. Findings most consistent with acute pancreatitis. No pancreatic necrosis. No focal fluid collection to suggest a pseudocyst or abscess. Electronically Signed   By: Elige Ko   On: 01/25/2017 14:46   US Abdomen Limited Ruq  Result Date: 01/25/2017 CLINICAL DATA:  Patient with right upper quadrant abdominal pain for 2 days. EXAM: US ABDOMEN LIMITED - RIGHT UPPER QUADRANT COMPARISON:  Abdominal ultrasound 02/22/2015. FINDINGS: Gallbladder: No cholelithiasis. No gallbladder wall thickening or pericholecystic fluid. Negative sonographic Murphy's sign. Small 2 mm polyp. Common bile duct: Diameter: 3.7 mm Liver: Within the right hepatic lobe there is a 2.3 x 1.6 x 1.1 cm probable complicated cystic lesion. Within the left hepatic lobe there is a 2.9 x 2.4 x 2.9 cm mildly hyperechoic lesion, indeterminate. IMPRESSION: No cholelithiasis or sonographic evidence for acute cholecystitis. Small gallbladder polyp. Hyperechoic mass within the left hepatic lobe is re- demonstrated and is nonspecific in etiology. Recommend dedicated evaluation with pre and post contrast-enhanced MRI in the non acute setting. Electronically Signed   By: Annia Belt M.D.   On: 01/25/2017 11:25    ED COURSE  Nursing notes and initial vitals signs, including pulse oximetry, reviewed.  Vitals:   01/26/17 0053 01/26/17 0345 01/26/17 0555  BP: (!) 136/97 (!) 151/86 (!) 139/91  Pulse: (!) 104 68 71  Resp: Temp: 98.2 F (36.8 C)    TempSrc: Oral    SpO2: 100% 99% 98%  Weight: 150 lb (68 kg)    Height:  (1.651 m)     6:10 AM Hospitalist to admit.   PROCEDURES    ED DIAGNOSES     ICD-9-CM ICD-10-CM   1. Alcohol-induced acute pancreatitis without infection or necrosis 577.0 K85.20        Paula Libra, MD 01/26/17 1610    Paula Libra, MD 01/26/17 [id number]

## 2017-01-26 NOTE — Progress Notes (Signed)
Patient had a small bowel movement which consisted of small blood clot and white liquid. She also had an episode of vomiting, which was green in color. Md notified. Will continue to monitor.

## 2017-01-26 NOTE — ED Notes (Signed)
Rizwan paged and called this writer back; per Butler Denmark will place pain medication per pt request.

## 2017-01-26 NOTE — Progress Notes (Signed)
Spoke with patient at bedside. Patient very concerned about ability to pay hospital bills. She states prior to admission she went to [facility] to talk with them about medical care, she also went to social services and tried to sign up for Medicaid. 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 She has been seen at [facility] in the past and plans to try to get appt there again. She has no current medication needs. Patient was somewhat drowsy from just receiving pain meds, will f/u later.

## 2017-01-26 NOTE — H&P (Signed)
History and Physical    Kylie Irwin VWU:[id number] DOB: 1971/01/08 DOA: 01/26/2017    PCP: No PCP Per Patient attempting to establish with [facility] Patient coming from: home  Chief Complaint: abdominal pain and vomiting  HPI: Kylie Irwin is a 46 y.o. female with a medical h/o gestational DM and one episode of acue pancreatitis. She presented to the ER yesterday with upper abdominal pain radiating to her back nausea and one episode of vomiting. She tells me that she had one black stool about 3 days ago. She drank alcohol on the Easter weekend. States she drinks occasionally and not daily and claims she is going to stop.  In the ER asked her today she underwent a CT scan of her abdomen pelvis and an ultrasound of the right upper quadrant, was diagnosed with acute pancreatitis. Hemoccult was also noted to be positive. The GI doctor spoke with Dr. Evette Cristal who suspected acute pancreatitis and acute gastritis secondary to alcohol use. She was discharged on Pepcid, Protonix, Norco and asked to return if symptoms worsened. She states the pain became increasingly severe and intolerable. Despite being on clear liquids, she vomited again today and therefore returned to the ER.  She does not usually have nausea or vomiting after meals or indigestion. No reflux no history of peptic ulcer disease in the past. As mentioned one episode of black stool which was described as a "hurt". And no stool since then. Feels constipated and bloated.  ED Course:   mild hypertension-heart rate 80s to low 100s Lipase 1149 up from 216 yesterday Sodium 132, bicarbonate 16, WBC 11 UA shows ketones and an elevated specific gravity  Review of Systems:  Palpitations every 3-4 months make her feel jitterish and last 3-4 hrs  Easy bruising All other systems reviewed and apart from HPI, are negative.  Past Medical History:  Diagnosis Date  . Pancreatitis     Past Surgical History:  Procedure Laterality Date  .  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 Lives with her husband.  Allergies  Allergen Reactions  . Shrimp [Shellfish Allergy] Hives, Swelling and Other (See Comments)    Reaction:  Facial swelling     Family history- Grandmother had some sort of stomach cancer   Prior to Admission medications   Medication Sig Start Date End Date Taking? Authorizing Provider  acetaminophen (TYLENOL) 500 MG tablet Take 1,000 mg by mouth every 6 (six) hours as needed for mild pain, moderate pain, fever or headache.   Yes Historical Provider, MD  famotidine (PEPCID) 20 MG tablet Take 1 tablet (20 mg total) by mouth 2 (two) times daily. 01/25/17  Yes Pricilla Loveless, MD  HYDROcodone-acetaminophen (NORCO) 5-325 MG tablet Take 1 tablet by mouth every 4 (four) hours as needed for severe pain. 01/25/17  Yes Pricilla Loveless, MD  pantoprazole (PROTONIX) 40 MG tablet Take 1 tablet (40 mg total) by mouth 2 (two) times daily. 01/25/17  Yes Pricilla Loveless, MD    Physical Exam: Vitals:   01/26/17 0718 01/26/17 0826 01/26/17 0937 01/26/17 1001  BP: (!) 145/85 (!) 154/93 (!) 147/97 (!) 142/92  Pulse: 89 97 83 (!) 108  Resp: 20 20 20 17   Temp: 97.7 F (36.5 C)   98.2 F (36.8 C)  TempSrc: Oral   Oral  SpO2: 100% 99% 100% 100%  Weight:      Height:          Constitutional: NAD, calm, comfortable Eyes: PERTLA, lids and conjunctivae normal ENMT: Mucous membranes are Dry. Posterior pharynx clear of any exudate or lesions. Normal dentition.  Neck: normal, supple, no masses, no thyromegaly Respiratory: clear to auscultation bilaterally, no wheezing, no crackles. Normal respiratory effort. No accessory muscle use.  Cardiovascular: S1 & S2 heard, regular rate and rhythm, no murmurs / rubs  / gallops. No extremity edema. 2+ pedal pulses. No carotid bruits.  Abdomen: No distension,  Tenderness across upper abdomen, no masses palpated. No hepatosplenomegaly. Bowel sounds normal.  Musculoskeletal: no clubbing / cyanosis. No joint deformity upper and lower extremities. Good ROM, no contractures. Normal muscle tone.  Skin: no rashes, lesions, ulcers. No induration Neurologic: CN 2-12 grossly intact. Sensation intact, DTR normal. Strength 5/5 in all 4 limbs.  Psychiatric: Normal judgment and insight. Alert and oriented x 3. Normal mood.     Labs on Admission: I have personally reviewed following labs and imaging studies  CBC:  Recent Labs Lab 01/25/17 1013 01/26/17 0104  WBC 9.9 11.7*  NEUTROABS  --  9.4*  HGB 14.8 15.0  HCT 42.6 43.1  MCV 97.9 98.4  PLT 221 213   Basic Metabolic Panel:  Recent Labs Lab 01/25/17 1013 01/26/17 0104  NA 135 132*  K 4.0 3.6  CL 104 104  CO2 20* 16*  GLUCOSE 91 90  BUN 6 5*  CREATININE 0.96 0.80  CALCIUM 9.0 8.7*   GFR: Estimated Creatinine Clearance: 80.8 mL/min (by C-G formula based on SCr of 0.8 mg/dL). Liver Function Tests:  Recent Labs Lab 01/25/17 1013 01/26/17 0104  AST 27 19  ALT 22 19  ALKPHOS 73 67  BILITOT 0.9 0.9  PROT 7.3 7.2  ALBUMIN 3.7 3.7    Recent Labs Lab 01/25/17 1013 01/26/17 0104  LIPASE 216* 1,149*   No results for input(s): AMMONIA in the last 168 hours. Coagulation Profile: No results for input(s): INR, PROTIME in the last 168 hours. Cardiac Enzymes: No results for input(s): CKTOTAL, CKMB, CKMBINDEX, TROPONINI in the last 168 hours. BNP (last 3 results) No results for input(s): PROBNP in the last 8760 hours. HbA1C: No results for input(s): HGBA1C in the last 72 hours. CBG: No results for input(s): GLUCAP in the last 168 hours. Lipid Profile: No results for input(s): CHOL, HDL, LDLCALC, TRIG, CHOLHDL, LDLDIRECT in the last 72 hours. Thyroid Function Tests: No results for input(s):  TSH, T4TOTAL, FREET4, T3FREE, THYROIDAB in the last 72 hours. Anemia Panel: No results for input(s): VITAMINB12, FOLATE, FERRITIN, TIBC, IRON, RETICCTPCT in the last 72 hours. Urine analysis:    Component Value Date/Time   COLORURINE YELLOW 01/26/2017 0152   APPEARANCEUR CLEAR 01/26/2017 0152   LABSPEC >1.046 (H) 01/26/2017 0152   PHURINE 5.0 01/26/2017 0152   GLUCOSEU NEGATIVE 01/26/2017 0152   HGBUR SMALL (A) 01/26/2017 0152   BILIRUBINUR NEGATIVE 01/26/2017 0152   KETONESUR 80 (A) 01/26/2017 0152   PROTEINUR 30 (A) 01/26/2017 0152   UROBILINOGEN 0.2 02/22/2015 1810   NITRITE NEGATIVE 01/26/2017 0152   LEUKOCYTESUR NEGATIVE 01/26/2017 0152   Sepsis Labs: (procalcitonin:4,lacticidven:4) )No results found for this or any previous visit (from the past 240 hour(s)).   Radiological Exams on Admission: Dg Chest 2 View  Result Date: 01/25/2017 CLINICAL DATA:  Right lower chest pain EXAM: CHEST  2 VIEW COMPARISON:  None. FINDINGS:  The heart size and mediastinal contours are within normal limits. Both lungs are clear. The visualized skeletal structures are unremarkable. IMPRESSION: No active cardiopulmonary disease. Electronically Signed   By: Marlan Palau M.D.   On: 01/25/2017 11:32   Ct Angio Chest Pe W/cm &/or Wo Cm  Result Date: 01/25/2017 CLINICAL DATA:  Patient is complaining of epigastric pain x 3 days which radiates to right side of back. Denies any trauma or injury to back. Patient states she was exposed to someone with recent sickness (n/v) in the past couple weeks. EXAM: CT ANGIOGRAPHY CHEST CT ABDOMEN AND PELVIS WITH CONTRAST TECHNIQUE: Multidetector CT imaging of the chest was performed using the standard protocol during bolus administration of intravenous contrast. Multiplanar CT image reconstructions and MIPs were obtained to evaluate the vascular anatomy. Multidetector CT imaging of the abdomen and pelvis was performed using the standard protocol during bolus  administration of intravenous contrast. COMPLICATIONS: TECHNOLOGIST REPORTS: PT'S EXISTING 20 G IV DIFFUSELY INFILTRATED DURING CT ANGIO CHEST--IV REMOVED, APPLIED COLD COMPRESS, PT WITH MINIMAL DISCOMFORT USED PT'S OTHER EXISTING 22G WITH DECREASED INJECTION RATE WITHOUT COMPLICATIONS PT'S RN LESLIE NOTIFIED IN ED CONTRAST:  100 mL Isovue 370 COMPARISON:  None. FINDINGS: CTA CHEST FINDINGS Cardiovascular: Satisfactory opacification of the pulmonary arteries to the segmental level. No evidence of pulmonary embolism. Normal heart size. No pericardial effusion. Normal caliber thoracic aorta. No thoracic aortic aneurysm. No thoracic aortic dissection. Mediastinum/Nodes: No enlarged mediastinal, hilar, or axillary lymph nodes. Thyroid gland, trachea, and esophagus demonstrate no significant findings. Lungs/Pleura: Lungs are clear. No pleural effusion or pneumothorax. Musculoskeletal: No chest wall abnormality. No acute or significant osseous findings. Review of the MIP images confirms the above findings. CT ABDOMEN and PELVIS FINDINGS Hepatobiliary: 14 mm hypodense, fluid attenuating right hepatic mass with thin internal septation most consistent with a cyst. Liver is otherwise normal. Normal gallbladder. Pancreas: Pancreas enhances normally. Severe peripancreatic inflammatory changes throughout the pancreas. No focal fluid collection. Spleen: Normal in size without focal abnormality. Adrenals/Urinary Tract: Adrenal glands are unremarkable. Kidneys are normal, without renal calculi, focal lesion, or hydronephrosis. Bladder is unremarkable. Stomach/Bowel: Stomach is within normal limits. Appendix appears normal. No evidence of bowel wall thickening, distention, or inflammatory changes. Vascular/Lymphatic: No significant vascular findings are present. No enlarged abdominal or pelvic lymph nodes. Reproductive: No adnexal mass. Small right lower uterine mass measuring 14 mm consistent with a fibroid. 2.2 cm uterine fundal  mass most consistent with a fibroid. Other: Small amount of abdominal and pelvic free fluid. No abdominal wall hernia. Musculoskeletal: No acute osseous abnormality. No lytic or sclerotic osseous lesion. Review of the MIP images confirms the above findings. IMPRESSION: 1. No with pulmonary embolus. 2. No aortic dissection or aortic aneurysm. 3. Findings most consistent with acute pancreatitis. No pancreatic necrosis. No focal fluid collection to suggest a pseudocyst or abscess. Electronically Signed   By: Elige Ko   On: 01/25/2017 14:46   Ct Abdomen Pelvis W Contrast  Result Date: 01/25/2017 CLINICAL DATA:  Patient is complaining of epigastric pain x 3 days which radiates to right side of back. Denies any trauma or injury to back. Patient states she was exposed to someone with recent sickness (n/v) in the past couple weeks. EXAM: CT ANGIOGRAPHY CHEST CT ABDOMEN AND PELVIS WITH CONTRAST TECHNIQUE: Multidetector CT imaging of the chest was performed using the standard protocol during bolus administration of intravenous contrast. Multiplanar CT image reconstructions and MIPs were obtained to evaluate the vascular anatomy. Multidetector CT imaging of the  abdomen and pelvis was performed using the standard protocol during bolus administration of intravenous contrast. COMPLICATIONS: TECHNOLOGIST REPORTS: PT'S EXISTING 20 G IV DIFFUSELY INFILTRATED DURING CT ANGIO CHEST--IV REMOVED, APPLIED COLD COMPRESS, PT WITH MINIMAL DISCOMFORT USED PT'S OTHER EXISTING 22G WITH DECREASED INJECTION RATE WITHOUT COMPLICATIONS PT'S RN LESLIE NOTIFIED IN ED CONTRAST:  100 mL Isovue 370 COMPARISON:  None. FINDINGS: CTA CHEST FINDINGS Cardiovascular: Satisfactory opacification of the pulmonary arteries to the segmental level. No evidence of pulmonary embolism. Normal heart size. No pericardial effusion. Normal caliber thoracic aorta. No thoracic aortic aneurysm. No thoracic aortic dissection. Mediastinum/Nodes: No enlarged  mediastinal, hilar, or axillary lymph nodes. Thyroid gland, trachea, and esophagus demonstrate no significant findings. Lungs/Pleura: Lungs are clear. No pleural effusion or pneumothorax. Musculoskeletal: No chest wall abnormality. No acute or significant osseous findings. Review of the MIP images confirms the above findings. CT ABDOMEN and PELVIS FINDINGS Hepatobiliary: 14 mm hypodense, fluid attenuating right hepatic mass with thin internal septation most consistent with a cyst. Liver is otherwise normal. Normal gallbladder. Pancreas: Pancreas enhances normally. Severe peripancreatic inflammatory changes throughout the pancreas. No focal fluid collection. Spleen: Normal in size without focal abnormality. Adrenals/Urinary Tract: Adrenal glands are unremarkable. Kidneys are normal, without renal calculi, focal lesion, or hydronephrosis. Bladder is unremarkable. Stomach/Bowel: Stomach is within normal limits. Appendix appears normal. No evidence of bowel wall thickening, distention, or inflammatory changes. Vascular/Lymphatic: No significant vascular findings are present. No enlarged abdominal or pelvic lymph nodes. Reproductive: No adnexal mass. Small right lower uterine mass measuring 14 mm consistent with a fibroid. 2.2 cm uterine fundal mass most consistent with a fibroid. Other: Small amount of abdominal and pelvic free fluid. No abdominal wall hernia. Musculoskeletal: No acute osseous abnormality. No lytic or sclerotic osseous lesion. Review of the MIP images confirms the above findings. IMPRESSION: 1. No with pulmonary embolus. 2. No aortic dissection or aortic aneurysm. 3. Findings most consistent with acute pancreatitis. No pancreatic necrosis. No focal fluid collection to suggest a pseudocyst or abscess. Electronically Signed   By: Elige Ko   On: 01/25/2017 14:46   US Abdomen Limited Ruq  Result Date: 01/25/2017 CLINICAL DATA:  Patient with right upper quadrant abdominal pain for 2 days. EXAM: US  ABDOMEN LIMITED - RIGHT UPPER QUADRANT COMPARISON:  Abdominal ultrasound 02/22/2015. FINDINGS: Gallbladder: No cholelithiasis. No gallbladder wall thickening or pericholecystic fluid. Negative sonographic Murphy's sign. Small 2 mm polyp. Common bile duct: Diameter: 3.7 mm Liver: Within the right hepatic lobe there is a 2.3 x 1.6 x 1.1 cm probable complicated cystic lesion. Within the left hepatic lobe there is a 2.9 x 2.4 x 2.9 cm mildly hyperechoic lesion, indeterminate. IMPRESSION: No cholelithiasis or sonographic evidence for acute cholecystitis. Small gallbladder polyp. Hyperechoic mass within the left hepatic lobe is re- demonstrated and is nonspecific in etiology. Recommend dedicated evaluation with pre and post contrast-enhanced MRI in the non acute setting. Electronically Signed   By: Annia Belt M.D.   On: 01/25/2017 11:25    EKG: Independently reviewed. sinus rhythm at 65 bpm  Assessment/Plan Principal Problem:   Acute pancreatitis - Likely due to alcohol abuse -Right upper quadrant ultrasound and CT with contrast did not show any biliary issues -We'll check triglycerides -Nothing by mouth except ice chips and meds - Aggressive IV fluids -Pain control- Oxycodone 5 mg QID, Oxycodone  Q6 PRN, Fentanyl for breakthrough only - nausea - Zofran QID, Phenergan PRN Q 6   Active Problems:  Episode of black stool, Hemoccult positive -Twice  a day Protonix -Follow for further episodes-not had any stool despite drinking contrast yesterday    Alcohol abuse -States that she drinks occasionally-we'll continue to follow on a CIWA in case she is not being truthful    Liver lesion, left lobe -1 indeterminate lesion and one cyst-outpatient follow-up    Tobacco use disorder - Nicotine patch-states she will quit    Dehydration, hyponatremia  - Elevated specific gravity on UA-IV fluids ordered  Metabolic acidosis - Follow    Marijuana abuse -Advised to quit      DVT prophylaxis:  SCDs Code Status: full code  Family Communication:   Disposition Plan: med/surg  Consults called: none  Admission status: admit to inpatient    Calvert Cantor MD [facility] Pager: [email or web address] Password [email or web address] 7PM-7AM, please contact night-coverage   01/26/2017, 10:22 AM

## 2017-01-26 NOTE — ED Notes (Signed)
Hospitalist at bedside assessing pt at present time. Baron Hamper EMT transporting pt to floor post assessment completion.

## 2017-01-27 LAB — BASIC METABOLIC PANEL
ANION GAP: 6 (ref 5–15)
BUN: <5 mg/dL — ABNORMAL LOW (ref 6–20)
CO2: 24 mmol/L (ref 22–32)
Calcium: 8.2 mg/dL — ABNORMAL LOW (ref 8.9–10.3)
Chloride: 105 mmol/L (ref 101–111)
Creatinine, Ser: 0.7 mg/dL (ref 0.44–1.00)
GLUCOSE: 103 mg/dL — AB (ref 65–99)
POTASSIUM: 4 mmol/L (ref 3.5–5.1)
Sodium: 135 mmol/L (ref 135–145)

## 2017-01-27 LAB — CBC
HEMATOCRIT: 38.4 % (ref 36.0–46.0)
HEMOGLOBIN: 13 g/dL (ref 12.0–15.0)
MCH: 33.1 pg (ref 26.0–34.0)
MCHC: 33.9 g/dL (ref 30.0–36.0)
MCV: 97.7 fL (ref 78.0–100.0)
Platelets: 171 10*3/uL (ref 150–400)
RBC: 3.93 MIL/uL (ref 3.87–5.11)
RDW: 13.9 % (ref 11.5–15.5)
WBC: 12.7 10*3/uL — ABNORMAL HIGH (ref 4.0–10.5)

## 2017-01-27 LAB — HIV ANTIBODY (ROUTINE TESTING W REFLEX): HIV Screen 4th Generation wRfx: NONREACTIVE

## 2017-01-27 LAB — LIPASE, BLOOD: LIPASE: 304 U/L — AB (ref 11–51)

## 2017-01-27 MED ORDER — ONDANSETRON HCL 4 MG/2ML IJ SOLN
4.0000 mg | Freq: Four times a day (QID) | INTRAMUSCULAR | Status: DC | PRN
  Administered 2017-01-28: 4 mg via INTRAVENOUS
  Filled 2017-01-27 (×2): qty 2

## 2017-01-27 MED ORDER — PANTOPRAZOLE SODIUM 40 MG PO TBEC
40.0000 mg | DELAYED_RELEASE_TABLET | Freq: Every day | ORAL | Status: DC
  Administered 2017-01-28 – 2017-01-29 (×2): 40 mg via ORAL
  Filled 2017-01-27 (×2): qty 1

## 2017-01-27 NOTE — Progress Notes (Signed)
[facility] PROGRESS NOTE  Kylie Irwin ZOX:[id number] DOB: 1971/03/20 DOA: 01/26/2017 PCP: No PCP Per Patient   Interim summary and HPI 46 y.o. female with a medical h/o gestational DM and one episode of acue pancreatitis. She presented to the ER yesterday with upper abdominal pain radiating to her back nausea and one episode of vomiting. She tells me that she had one black stool about 3 days ago. She drank alcohol on the Easter weekend. States she drinks occasionally and not daily and claims she is going to stop. CT scan of her abdomen pelvis and an ultrasound of the right upper quadrant, was diagnosed with acute pancreatitis. Assessment/Plan: 1-acute pancreatitis: due to alcohol abuse -neg TG -still with significant nausea and abd pain -lipase trending down and no further vomiting -will continue IVF, antiemetics and PRN analgesics  -advance diet slowly  2-alcohol, tobacco and marijuana abuse -cessation counseling provided -will use CIWA protocol -also started on nicoderm  3-GERD: and presumed gastritis -will continue PPI  4-Leucocytosis: -due to demargination most likely -no                                                                  Signs of -infection appreciated -continue IVF's and follow trend-   Code Status: Full Family Communication: mother at bedside   Disposition Plan: home in 1-2 days when tolerating diet and PO meds easier.   Consultants:  None   Procedures:  See below for x-ray reports   Antibiotics:  None   HPI/Subjective: Afebrile, no further vomiting. Still complaining of nausea and abd pain.  Objective: Vitals:   01/27/17 1400 01/27/17 2230  BP: 127/87 132/90  Pulse: 95 (!) 107  Resp: 18 18  Temp: 98.5 F (36.9 C) 99.1 F (37.3 C)    Intake/Output Summary (Last 24 hours) at 01/27/17 2314 Last data filed at 01/27/17 2100  Gross per 24 hour  Intake          3861.67 ml  Output                0 ml  Net          3861.67  ml   Filed Weights   01/26/17 0053  Weight: 68 kg (150 lb)    Exam:   General:  Afebrile, no CP or SOB. Still with mid epigastric pain and nausea. Reported no vomiting.  Cardiovascular: S1 and S2, no rubs or gallops  Respiratory: CTA bilaterally, normal effort  Abdomen: soft, mid epigastric tenderness, ND, positive BS.  Musculoskeletal: no edema, no cyanosis  Data Reviewed: Basic Metabolic Panel:  Recent Labs Lab 01/25/17 1013 01/26/17 0104 01/27/17 0601  NA 135 132* 135  K 4.0 3.6 4.0  CL 104 104 105  CO2 20* 16* 24  GLUCOSE 91 90 103*  BUN 6 5* <5*  CREATININE 0.96 0.80 0.70  CALCIUM 9.0 8.7* 8.2*   Liver Function Tests:  Recent Labs Lab 01/25/17 1013 01/26/17 0104  AST 27 19  ALT 22 19  ALKPHOS 73 67  BILITOT 0.9 0.9  PROT 7.3 7.2  ALBUMIN 3.7 3.7    Recent Labs Lab 01/25/17 1013 01/26/17 0104 01/27/17 0601  LIPASE 216* 1,149* 304*   CBC:  Recent Labs Lab 01/25/17 1013 01/26/17 0104 01/27/17 0601  WBC 9.9 11.7* 12.7*  NEUTROABS  --  9.4*  --   HGB 14.8 15.0 13.0  HCT 42.6 43.1 38.4  MCV 97.9 98.4 97.7  PLT 221 213 171    Studies: No results found.  Scheduled Meds: . chlorhexidine  15 mL Mouth Rinse BID  . folic acid  1 mg Oral Daily  . mouth rinse  15 mL Mouth Rinse q12n4p  . multivitamin with minerals  1 tablet Oral Daily  . nicotine  14 mg Transdermal Daily  . thiamine  100 mg Oral Daily   Or  . thiamine  100 mg Intravenous Daily   Continuous Infusions: . dextrose 5 % and 0.9 % NaCl with KCl 20 mEq/L 75 mL/hr at 01/27/17 2103    Principal Problem:   Acute pancreatitis Active Problems:   Liver lesion, left lobe   Tobacco use disorder   Dehydration   Marijuana abuse   Alcohol abuse   Time spent: 25 minutes    Vassie Loll  [facility] Pager [phone].  If 7PM-7AM, please contact night-coverage at [email or web address], password [email or web address] 01/27/2017, 11:14 PM  LOS: 1 day

## 2017-01-28 DIAGNOSIS — K5900 Constipation, unspecified: Secondary | ICD-10-CM

## 2017-01-28 LAB — BASIC METABOLIC PANEL
ANION GAP: 4 — AB (ref 5–15)
CALCIUM: 8.4 mg/dL — AB (ref 8.9–10.3)
CO2: 25 mmol/L (ref 22–32)
Chloride: 106 mmol/L (ref 101–111)
Creatinine, Ser: 0.68 mg/dL (ref 0.44–1.00)
GFR calc Af Amer: >60 mL/min (ref >60)
GLUCOSE: 107 mg/dL — AB (ref 65–99)
Potassium: 3.7 mmol/L (ref 3.5–5.1)
Sodium: 135 mmol/L (ref 135–145)

## 2017-01-28 LAB — URINALYSIS, ROUTINE W REFLEX MICROSCOPIC
BILIRUBIN URINE: NEGATIVE
GLUCOSE, UA: NEGATIVE mg/dL
HGB URINE DIPSTICK: NEGATIVE
KETONES UR: 5 mg/dL — AB
Leukocytes, UA: NEGATIVE
Nitrite: NEGATIVE
PROTEIN: NEGATIVE mg/dL
Specific Gravity, Urine: 1.008 (ref 1.005–1.030)
pH: 7 (ref 5.0–8.0)

## 2017-01-28 LAB — LIPASE, BLOOD: LIPASE: 74 U/L — AB (ref 11–51)

## 2017-01-28 MED ORDER — DOCUSATE SODIUM 100 MG PO CAPS
100.0000 mg | ORAL_CAPSULE | Freq: Two times a day (BID) | ORAL | Status: DC
  Administered 2017-01-28 – 2017-01-29 (×3): 100 mg via ORAL
  Filled 2017-01-28 (×3): qty 1

## 2017-01-28 MED ORDER — POLYETHYLENE GLYCOL 3350 17 G PO PACK
17.0000 g | PACK | Freq: Every day | ORAL | Status: DC
  Administered 2017-01-28 – 2017-01-29 (×2): 17 g via ORAL
  Filled 2017-01-28 (×2): qty 1

## 2017-01-28 NOTE — Progress Notes (Signed)
Pt continue to c/o urinating freq and not being able to sleep. She also c/o feeling bloated. Her lower Abd is slightly distended. Notified provider on call. Obtained order to I and O cath times 1.

## 2017-01-28 NOTE — Progress Notes (Signed)
Since we in and out cath pt, she reports that she hasn't had to urinate like she had before.

## 2017-01-28 NOTE — Progress Notes (Addendum)
[facility] PROGRESS NOTE  Kylie Irwin BJY:[id number] DOB: 07/21/1971 DOA: 01/26/2017 PCP: No PCP Per Patient   Interim summary and HPI 46 y.o. female with a medical h/o gestational DM and one episode of acue pancreatitis. She presented to the ER yesterday with upper abdominal pain radiating to her back nausea and one episode of vomiting. She tells me that she had one black stool about 3 days ago. She drank alcohol on the Easter weekend. States she drinks occasionally and not daily and claims she is going to stop. CT scan of her abdomen pelvis and an ultrasound of the right upper quadrant, was diagnosed with acute pancreatitis.  Assessment/Plan: 1-acute pancreatitis: due to alcohol abuse -neg high levels of TG as cause for pancreatitis -still with significant nausea, vomiting and abd pain -lipase trending down beautiful and stable bloodwork -will continue IVF, antiemetics and PRN analgesics  -advance diet slowly and increase physical activity  2-alcohol, tobacco and marijuana abuse -cessation counseling provided -will use CIWA protocol -also continue started Nicoderm  3-GERD: and presumed gastritis -will continue PPI  4-Leucocytosis: -due to demargination most likely -no Signs of infection appreciated -continue IVF's and follow WBC's trend  5-urinary symptoms and constipation -most likely associated with effects from narcotics -will encourage good hydration -increase physical activity and will add bowel regimen  6-Liver lesion -consistent with hepatic cyst -outpatient follow up and observation  Code Status: Full Family Communication: mother at bedside   Disposition Plan: home in 1-2 days when tolerating diet and PO meds easier; still with active nausea and vomiting.   Consultants:  None   Procedures:  See below for x-ray reports   Antibiotics:  None   HPI/Subjective: Afebrile, no CP or SOB. Positive nausea and active vomiting today. Overnight with  complaints of increase frequency and difficulty urinating. Also endorses constipation.  Objective: Vitals:   01/27/17 2230 01/28/17 0626  BP: 132/90 123/72  Pulse: (!) 107 92  Resp: 18 18  Temp: 99.1 F (37.3 C) 98.7 F (37.1 C)    Intake/Output Summary (Last 24 hours) at 01/28/17 1013 Last data filed at 01/28/17 0600  Gross per 24 hour  Intake             2610 ml  Output              550 ml  Net             2060 ml   Filed Weights   01/26/17 0053  Weight: 68 kg (150 lb)    Exam:   General:  Afebrile, no CP or SOB. Still with mid epigastric pain and actively experiencing N/V today. Overnight also expressed some increase frequency and difficulty urinating. Patient reported constipation.  Cardiovascular: S1 and S2, no rubs or gallops  Respiratory: CTA bilaterally, normal effort  Abdomen: soft, mid epigastric tenderness, ND appreciated, positive BS. Patient reported feeling bloated.  Musculoskeletal: no edema, no cyanosis  Data Reviewed: Basic Metabolic Panel:  Recent Labs Lab 01/25/17 1013 01/26/17 0104 01/27/17 0601 01/28/17 0552  NA 135 132* 135 135  K 4.0 3.6 4.0 3.7  CL 104 104 105 106  CO2 20* 16* 24 25  GLUCOSE 91 90 103* 107*  BUN 6 5* <5* <5*  CREATININE 0.96 0.80 0.70 0.68  CALCIUM 9.0 8.7* 8.2* 8.4*   Liver Function Tests:  Recent Labs Lab 01/25/17 1013 01/26/17 0104  AST 27 19  ALT 22 19  ALKPHOS 73 67  BILITOT 0.9 0.9  PROT 7.3 7.2  ALBUMIN 3.7 3.7    Recent Labs Lab 01/25/17 1013 01/26/17 0104 01/27/17 0601 01/28/17 0552  LIPASE 216* 1,149* 304* 74*   CBC:  Recent Labs Lab 01/25/17 1013 01/26/17 0104 01/27/17 0601  WBC 9.9 11.7* 12.7*  NEUTROABS  --  9.4*  --   HGB 14.8 15.0 13.0  HCT 42.6 43.1 38.4  MCV 97.9 98.4 97.7  PLT 221 213 171    Studies: No results found.  Scheduled Meds: . chlorhexidine  15 mL Mouth Rinse BID  . docusate sodium  100 mg Oral BID  . folic acid  1 mg Oral Daily  . mouth rinse  15 mL  Mouth Rinse q12n4p  . multivitamin with minerals  1 tablet Oral Daily  . nicotine  14 mg Transdermal Daily  . pantoprazole  40 mg Oral Q1200  . polyethylene glycol  17 g Oral Daily  . thiamine  100 mg Oral Daily   Or  . thiamine  100 mg Intravenous Daily   Continuous Infusions: . dextrose 5 % and 0.9 % NaCl with KCl 20 mEq/L 75 mL/hr at 01/27/17 2103    Principal Problem:   Acute pancreatitis Active Problems:   Liver lesion, left lobe   Tobacco use disorder   Dehydration   Marijuana abuse   Alcohol abuse   Time spent: 25 minutes    Vassie Loll  [facility] Pager [phone].  If 7PM-7AM, please contact night-coverage at [email or web address], password [facility] 01/28/2017, 10:13 AM  LOS: 2 days

## 2017-01-28 NOTE — Progress Notes (Signed)
After we in and out cath pt we obtained 200 cc urine. Pt had voided just before we cathed her.

## 2017-01-28 NOTE — Progress Notes (Signed)
Pt c/o having to urinate frequently. She stated she has done so 5 times in the past 2 hrs. Also c/o left flank pain and slight burning when she urinates, as well as only able to do small amounts at a time. Notified provider on call, received order for urinalysis.  Collected specimen and sent to lab.  Will continue to monitor

## 2017-01-29 DIAGNOSIS — K219 Gastro-esophageal reflux disease without esophagitis: Secondary | ICD-10-CM

## 2017-01-29 MED ORDER — NICOTINE 14 MG/24HR TD PT24: 14.0000 mg | MEDICATED_PATCH | Freq: Every day | TRANSDERMAL | 0 refills | Status: AC

## 2017-01-29 MED ORDER — PANTOPRAZOLE SODIUM 40 MG PO TBEC: 40.0000 mg | DELAYED_RELEASE_TABLET | Freq: Two times a day (BID) | ORAL | 1 refills | Status: AC

## 2017-01-29 MED ORDER — OXYCODONE HCL 5 MG PO TABS: 5.0000 mg | ORAL_TABLET | Freq: Four times a day (QID) | ORAL | 0 refills | Status: AC | PRN

## 2017-01-29 MED ORDER — THERA VITAL M PO TABS: 1.0000 | ORAL_TABLET | Freq: Every day | ORAL | 1 refills | Status: AC

## 2017-01-29 MED ORDER — POLYETHYLENE GLYCOL 3350 17 G PO PACK: 17.0000 g | PACK | Freq: Every day | ORAL | 0 refills | Status: AC

## 2017-01-29 MED ORDER — DOCUSATE SODIUM 100 MG PO CAPS: 100.0000 mg | ORAL_CAPSULE | Freq: Two times a day (BID) | ORAL | 0 refills | Status: AC

## 2017-01-29 NOTE — Discharge Summary (Signed)
Physician Discharge Summary  Kylie Irwin ZOX:[id number] DOB: 09/19/1971 DOA: 01/26/2017  PCP: No PCP Per Patient  Admit date: 01/26/2017 Discharge date: 01/29/2017  Time spent: 35 minutes  Recommendations for Outpatient Follow-up:  1. Repeat BMET to follow electrolytes and renal function  2. Please repeat CBC to follow WBC's trend   Discharge Diagnoses:  Principal Problem:   Acute pancreatitis Active Problems:   Liver lesion, left lobe   Tobacco use disorder   Dehydration   Marijuana abuse   Alcohol abuse   Gastroesophageal reflux disease   Discharge Condition: stable and improved. Patient advise [facility]  Diet recommendation: low fat diet   Filed Weights   01/26/17 0053  Weight: 68 kg (150 lb)    History of present illness:  46 y.o.femalewith a medical h/o gestational DM and one episode of acue pancreatitis. She presented to the ER yesterday with upper abdominal pain radiating to her back nausea and one episode of vomiting. She tells me that she had one black stool about 3 days ago. She drank alcohol on the Easter weekend. States she drinks occasionally and not daily and claims she is going to stop. CT scan of her abdomen pelvis and an ultrasound of the right upper quadrant, confirmed diagnosis of acute pancreatitis.  Hospital Course:  1-acute pancreatitis: due to alcohol abuse -neg high levels of TG as cause for pancreatitis -at discharge no longer nauseated and w/o vomiting -advise to stop alcohol use and to follow low fat diet -resolved with conservative management of IVF, antiemetics and PRN analgesics   2-alcohol, tobacco and marijuana abuse -cessation counseling provided -no withdrawal appreciated -discharge with Nicoderm prescription -Child psychotherapist provided resources   3-GERD: and presumed gastritis -will continue PPI  4-Leucocytosis: -due to demargination most likely -no Signs of acute infection  appreciated -patient remained afebrile -repeat CBC at follow up to assess WBC's trend  5-urinary symptoms and constipation -most likely associated with effects from narcotics -will encourage to maintain good hydration -increase physical activity and will add bowel regimen (colace and miralax)  6-Liver lesion -consistent with hepatic cyst -outpatient follow up and observation  Procedures: See below for x-ray reports   Consultations:  None   Discharge Exam: Vitals:   01/29/17 0511 01/29/17 1419  BP: 123/81 (!) 141/83  Pulse: 99 (!) 111  Resp: 18 18  Temp: 97.5 F (36.4 C) 98.5 F (36.9 C)     General:  Afebrile, no CP or SOB. Still with mild epigastric pain; but no longer experiencing nausea or vomiting. Patient tolerating diet and PO meds prior to discharge. Also had small BM.  Cardiovascular: S1 and S2, no rubs or gallops  Respiratory: CTA bilaterally, normal effort  Abdomen: soft, mid epigastric tenderness, ND appreciated, positive BS. Patient reported feeling bloated.  Musculoskeletal: no edema, no cyanosis   Discharge Instructions   Discharge Instructions    Discharge instructions    Complete by:  As directed    Take medications as prescribed Stop tobacco abuse and stop alcohol use Keep yourself well hydrated Follow a low fat diet     Current Discharge Medication List    START taking these medications   Details  docusate sodium (COLACE) 100 MG capsule Take 1 capsule (100 mg total) by mouth 2 (two) times daily. Qty: 60 capsule, Refills: 0    Multiple Vitamins-Minerals (MULTIVITAMIN) tablet Take 1 tablet by mouth daily. Qty: 30 tablet, Refills: 1    nicotine (NICODERM CQ - DOSED  IN MG/24 HOURS) 14 mg/24hr patch Place 1 patch (14 mg total) onto the skin daily. Qty: 28 patch, Refills: 0    oxyCODONE (OXY IR/ROXICODONE) 5 MG immediate release tablet Take 1 tablet (5 mg total) by mouth every 6 (six) hours as needed for moderate pain. Qty: 20  tablet, Refills: 0    polyethylene glycol (MIRALAX / GLYCOLAX) packet Take 17 g by mouth daily. Qty: 28 each, Refills: 0      CONTINUE these medications which have CHANGED   Details  pantoprazole (PROTONIX) 40 MG tablet Take 1 tablet (40 mg total) by mouth 2 (two) times daily. Qty: 30 tablet, Refills: 1      CONTINUE these medications which have NOT CHANGED   Details  acetaminophen (TYLENOL) 500 MG tablet Take 1,000 mg by mouth every 6 (six) hours as needed for mild pain, moderate pain, fever or headache.      STOP taking these medications     famotidine (PEPCID) 20 MG tablet      HYDROcodone-acetaminophen (NORCO) 5-325 MG tablet        Allergies  Allergen Reactions  . Shrimp [Shellfish Allergy] Hives, Swelling and Other (See Comments)    Reaction:  Facial swelling    Follow-up Information     [facility] Follow up.   Why:  please call to arrange appointment Contact information: [address] [place] [address] [phone]           The results of significant diagnostics from this hospitalization (including imaging, microbiology, ancillary and laboratory) are listed below for reference.    Significant Diagnostic Studies: Dg Chest 2 View  Result Date: 01/25/2017 CLINICAL DATA:  Right lower chest pain EXAM: CHEST  2 VIEW COMPARISON:  None. FINDINGS: The heart size and mediastinal contours are within normal limits. Both lungs are clear. The visualized skeletal structures are unremarkable. IMPRESSION: No active cardiopulmonary disease. Electronically Signed   By: Marlan Palau M.D.   On: 01/25/2017 11:32   Ct Angio Chest Pe W/cm &/or Wo Cm  Result Date: 01/25/2017 CLINICAL DATA:  Patient is complaining of epigastric pain x 3 days which radiates to right side of back. Denies any trauma or injury to back. Patient states she was exposed to someone with recent sickness (n/v) in the past couple weeks. EXAM: CT ANGIOGRAPHY  CHEST CT ABDOMEN AND PELVIS WITH CONTRAST TECHNIQUE: Multidetector CT imaging of the chest was performed using the standard protocol during bolus administration of intravenous contrast. Multiplanar CT image reconstructions and MIPs were obtained to evaluate the vascular anatomy. Multidetector CT imaging of the abdomen and pelvis was performed using the standard protocol during bolus administration of intravenous contrast. COMPLICATIONS: TECHNOLOGIST REPORTS: PT'S EXISTING 20 G IV DIFFUSELY INFILTRATED DURING CT ANGIO CHEST--IV REMOVED, APPLIED COLD COMPRESS, PT WITH MINIMAL DISCOMFORT USED PT'S OTHER EXISTING 22G WITH DECREASED INJECTION RATE WITHOUT COMPLICATIONS PT'S RN LESLIE NOTIFIED IN ED CONTRAST:  100 mL Isovue 370 COMPARISON:  None. FINDINGS: CTA CHEST FINDINGS Cardiovascular: Satisfactory opacification of the pulmonary arteries to the segmental level. No evidence of pulmonary embolism. Normal heart size. No pericardial effusion. Normal caliber thoracic aorta. No thoracic aortic aneurysm. No thoracic aortic dissection. Mediastinum/Nodes: No enlarged mediastinal, hilar, or axillary lymph nodes. Thyroid gland, trachea, and esophagus demonstrate no significant findings. Lungs/Pleura: Lungs are clear. No pleural effusion or pneumothorax. Musculoskeletal: No chest wall abnormality. No acute or significant osseous findings. Review of the MIP images confirms the above findings. CT ABDOMEN and PELVIS FINDINGS  Hepatobiliary: 14 mm hypodense, fluid attenuating right hepatic mass with thin internal septation most consistent with a cyst. Liver is otherwise normal. Normal gallbladder. Pancreas: Pancreas enhances normally. Severe peripancreatic inflammatory changes throughout the pancreas. No focal fluid collection. Spleen: Normal in size without focal abnormality. Adrenals/Urinary Tract: Adrenal glands are unremarkable. Kidneys are normal, without renal calculi, focal lesion, or hydronephrosis. Bladder is unremarkable.  Stomach/Bowel: Stomach is within normal limits. Appendix appears normal. No evidence of bowel wall thickening, distention, or inflammatory changes. Vascular/Lymphatic: No significant vascular findings are present. No enlarged abdominal or pelvic lymph nodes. Reproductive: No adnexal mass. Small right lower uterine mass measuring 14 mm consistent with a fibroid. 2.2 cm uterine fundal mass most consistent with a fibroid. Other: Small amount of abdominal and pelvic free fluid. No abdominal wall hernia. Musculoskeletal: No acute osseous abnormality. No lytic or sclerotic osseous lesion. Review of the MIP images confirms the above findings. IMPRESSION: 1. No with pulmonary embolus. 2. No aortic dissection or aortic aneurysm. 3. Findings most consistent with acute pancreatitis. No pancreatic necrosis. No focal fluid collection to suggest a pseudocyst or abscess. Electronically Signed   By: Elige Ko   On: 01/25/2017 14:46   Ct Abdomen Pelvis W Contrast  Result Date: 01/25/2017 CLINICAL DATA:  Patient is complaining of epigastric pain x 3 days which radiates to right side of back. Denies any trauma or injury to back. Patient states she was exposed to someone with recent sickness (n/v) in the past couple weeks. EXAM: CT ANGIOGRAPHY CHEST CT ABDOMEN AND PELVIS WITH CONTRAST TECHNIQUE: Multidetector CT imaging of the chest was performed using the standard protocol during bolus administration of intravenous contrast. Multiplanar CT image reconstructions and MIPs were obtained to evaluate the vascular anatomy. Multidetector CT imaging of the abdomen and pelvis was performed using the standard protocol during bolus administration of intravenous contrast. COMPLICATIONS: TECHNOLOGIST REPORTS: PT'S EXISTING 20 G IV DIFFUSELY INFILTRATED DURING CT ANGIO CHEST--IV REMOVED, APPLIED COLD COMPRESS, PT WITH MINIMAL DISCOMFORT USED PT'S OTHER EXISTING 22G WITH DECREASED INJECTION RATE WITHOUT COMPLICATIONS PT'S RN LESLIE NOTIFIED IN  ED CONTRAST:  100 mL Isovue 370 COMPARISON:  None. FINDINGS: CTA CHEST FINDINGS Cardiovascular: Satisfactory opacification of the pulmonary arteries to the segmental level. No evidence of pulmonary embolism. Normal heart size. No pericardial effusion. Normal caliber thoracic aorta. No thoracic aortic aneurysm. No thoracic aortic dissection. Mediastinum/Nodes: No enlarged mediastinal, hilar, or axillary lymph nodes. Thyroid gland, trachea, and esophagus demonstrate no significant findings. Lungs/Pleura: Lungs are clear. No pleural effusion or pneumothorax. Musculoskeletal: No chest wall abnormality. No acute or significant osseous findings. Review of the MIP images confirms the above findings. CT ABDOMEN and PELVIS FINDINGS Hepatobiliary: 14 mm hypodense, fluid attenuating right hepatic mass with thin internal septation most consistent with a cyst. Liver is otherwise normal. Normal gallbladder. Pancreas: Pancreas enhances normally. Severe peripancreatic inflammatory changes throughout the pancreas. No focal fluid collection. Spleen: Normal in size without focal abnormality. Adrenals/Urinary Tract: Adrenal glands are unremarkable. Kidneys are normal, without renal calculi, focal lesion, or hydronephrosis. Bladder is unremarkable. Stomach/Bowel: Stomach is within normal limits. Appendix appears normal. No evidence of bowel wall thickening, distention, or inflammatory changes. Vascular/Lymphatic: No significant vascular findings are present. No enlarged abdominal or pelvic lymph nodes. Reproductive: No adnexal mass. Small right lower uterine mass measuring 14 mm consistent with a fibroid. 2.2 cm uterine fundal mass most consistent with a fibroid. Other: Small amount of abdominal and pelvic free fluid. No abdominal wall hernia. Musculoskeletal: No acute osseous abnormality.  No lytic or sclerotic osseous lesion. Review of the MIP images confirms the above findings. IMPRESSION: 1. No with pulmonary embolus. 2. No aortic  dissection or aortic aneurysm. 3. Findings most consistent with acute pancreatitis. No pancreatic necrosis. No focal fluid collection to suggest a pseudocyst or abscess. Electronically Signed   By: Elige Ko   On: 01/25/2017 14:46   US Abdomen Limited Ruq  Result Date: 01/25/2017 CLINICAL DATA:  Patient with right upper quadrant abdominal pain for 2 days. EXAM: US ABDOMEN LIMITED - RIGHT UPPER QUADRANT COMPARISON:  Abdominal ultrasound 02/22/2015. FINDINGS: Gallbladder: No cholelithiasis. No gallbladder wall thickening or pericholecystic fluid. Negative sonographic Murphy's sign. Small 2 mm polyp. Common bile duct: Diameter: 3.7 mm Liver: Within the right hepatic lobe there is a 2.3 x 1.6 x 1.1 cm probable complicated cystic lesion. Within the left hepatic lobe there is a 2.9 x 2.4 x 2.9 cm mildly hyperechoic lesion, indeterminate. IMPRESSION: No cholelithiasis or sonographic evidence for acute cholecystitis. Small gallbladder polyp. Hyperechoic mass within the left hepatic lobe is re- demonstrated and is nonspecific in etiology. Recommend dedicated evaluation with pre and post contrast-enhanced MRI in the non acute setting. Electronically Signed   By: Annia Belt M.D.   On: 01/25/2017 11:25    Labs: Basic Metabolic Panel:  Recent Labs Lab 01/25/17 1013 01/26/17 0104 01/27/17 0601 01/28/17 0552  NA 135 132* 135 135  K 4.0 3.6 4.0 3.7  CL 104 104 105 106  CO2 20* 16* 24 25  GLUCOSE 91 90 103* 107*  BUN 6 5* <5* <5*  CREATININE 0.96 0.80 0.70 0.68  CALCIUM 9.0 8.7* 8.2* 8.4*   Liver Function Tests:  Recent Labs Lab 01/25/17 1013 01/26/17 0104  AST 27 19  ALT 22 19  ALKPHOS 73 67  BILITOT 0.9 0.9  PROT 7.3 7.2  ALBUMIN 3.7 3.7    Recent Labs Lab 01/25/17 1013 01/26/17 0104 01/27/17 0601 01/28/17 0552  LIPASE 216* 1,149* 304* 74*   CBC:  Recent Labs Lab 01/25/17 1013 01/26/17 0104 01/27/17 0601  WBC 9.9 11.7* 12.7*  NEUTROABS  --  9.4*  --   HGB 14.8 15.0 13.0   HCT 42.6 43.1 38.4  MCV 97.9 98.4 97.7  PLT 221 213 171     Signed:  Vassie Loll MD.  [facility] 01/29/2017, 4:16 PM
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
## 2017-11-16 IMAGING — CT CT ABD-PELV W/ CM
3 of 11 series · 11 of 46 positions shown, 16 images · IV contrast (agent unspecified)
Comparison: None.

CLINICAL DATA: Patient is complaining of epigastric pain x 3 days
which radiates to right side of back. Denies any trauma or injury to
back. Patient states she was exposed to someone with recent sickness
(n/v) in the past couple weeks.



[Series 10: abd/pel with · axial · 0.74mm/px · z∈[+1239,+1449]mm · 3 of 85 slices shown, 7 images]
[im 22/85  soft-tissue]
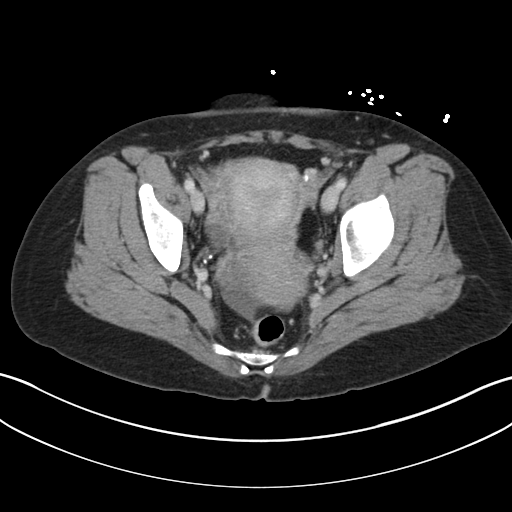
[im 22/85  lung]
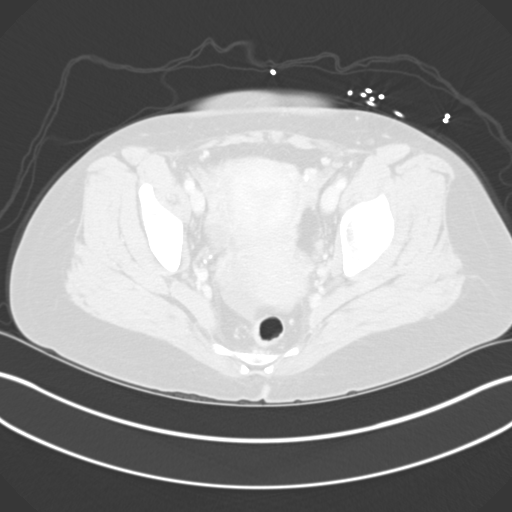
[im 22/85  bone]
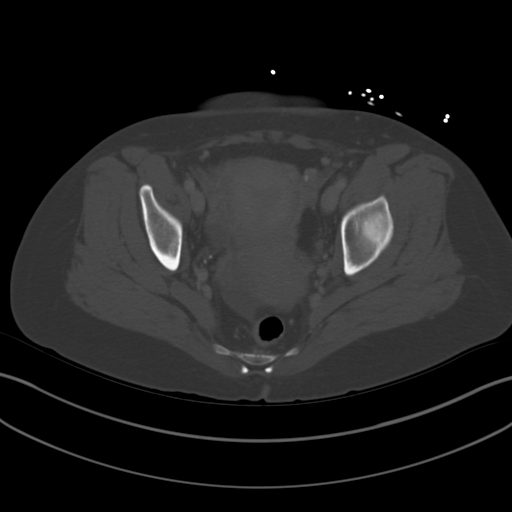
[im 43/85  soft-tissue]
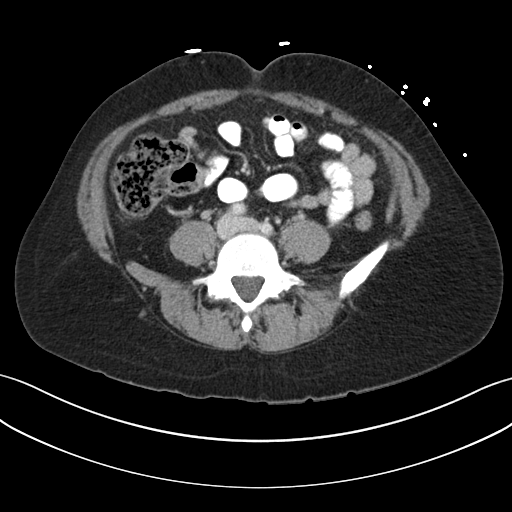
[im 43/85  lung]
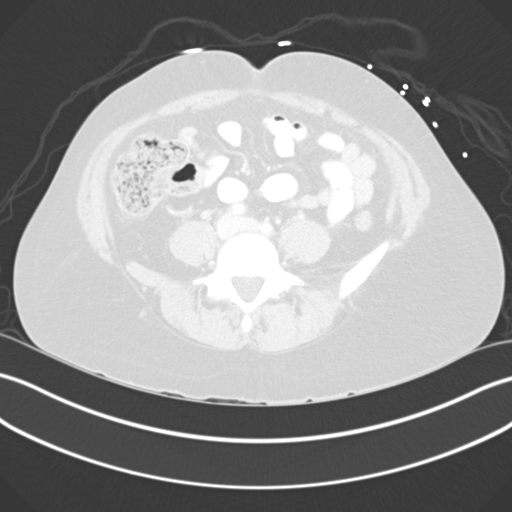
[im 64/85  soft-tissue]
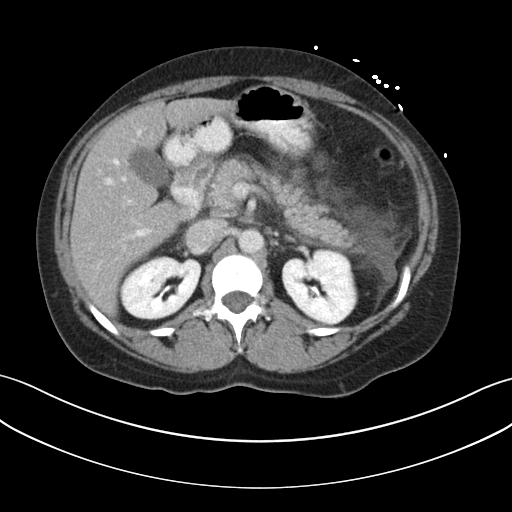
[im 64/85  lung]
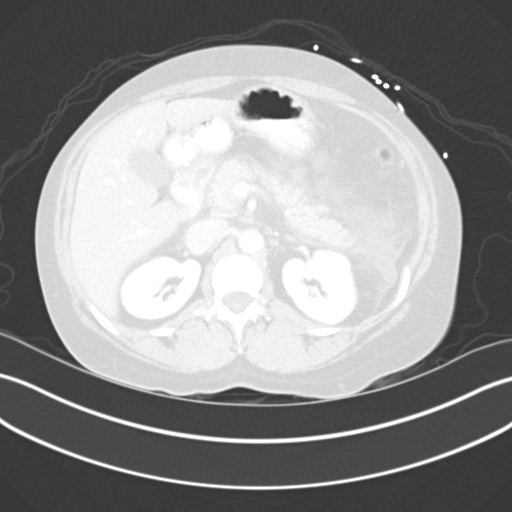

[Series 12: thins for pacs · axial · 0.67mm/px · z∈[+1455,+1634]mm · 6 of 287 slices shown]
[im 18/287  soft-tissue]
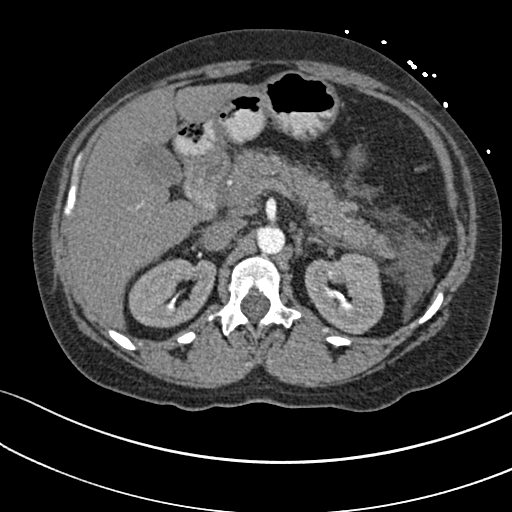
[im 54/287  soft-tissue]
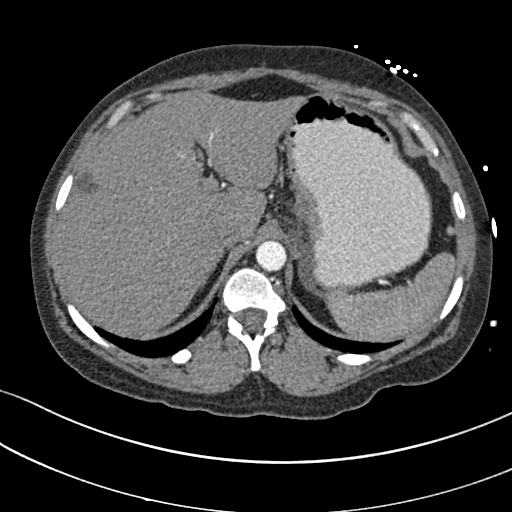
[im 90/287  soft-tissue]
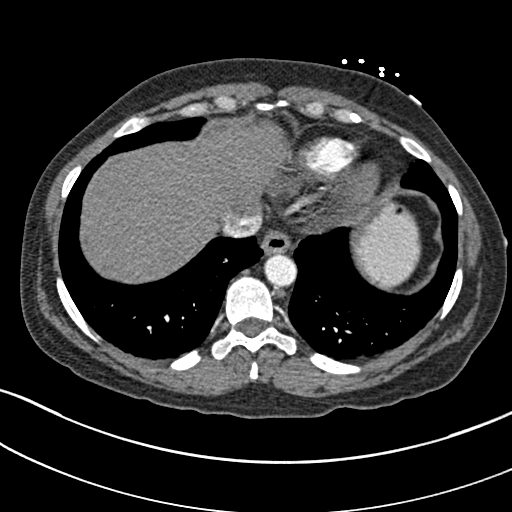
[im 126/287  soft-tissue]
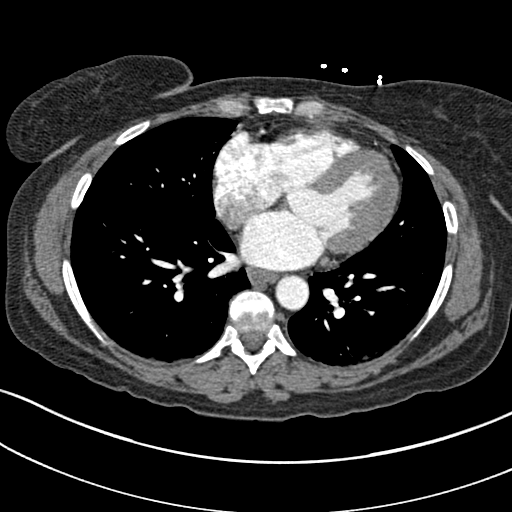
[im 161/287  soft-tissue]
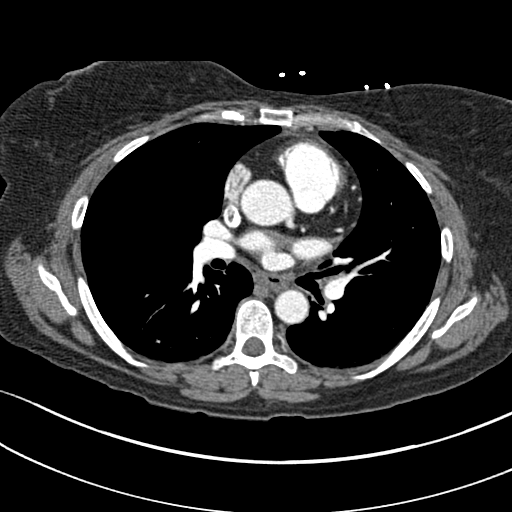
[im 197/287  soft-tissue]
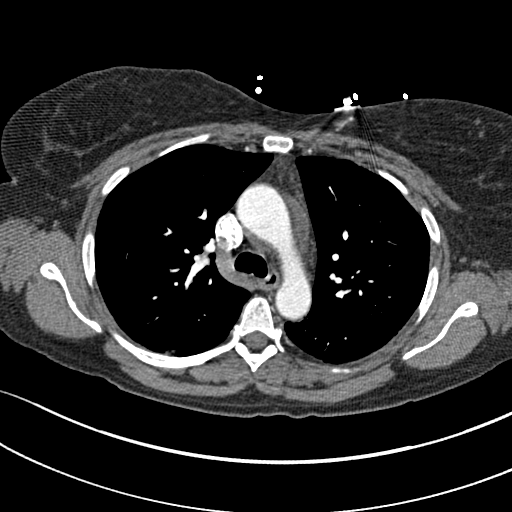

[Series 14: coronal mpr · coronal · 0.56mm/px · 2 of 124 slices shown, 3 images]
[im 42/124  soft-tissue]
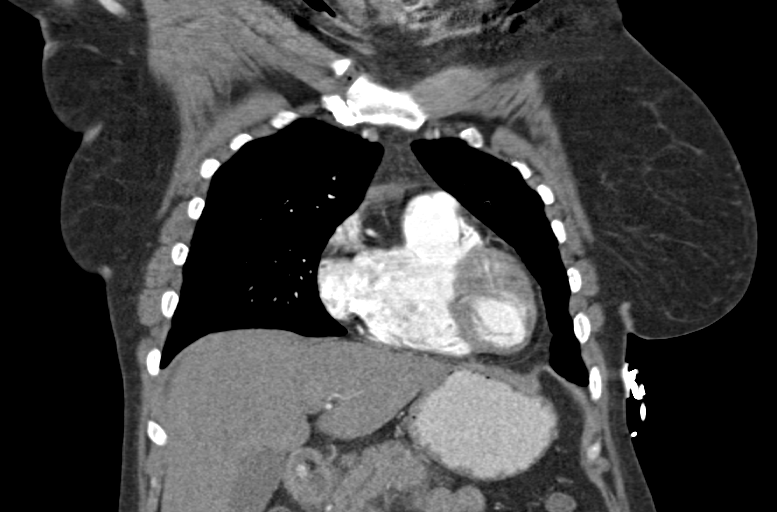
[im 42/124  bone]
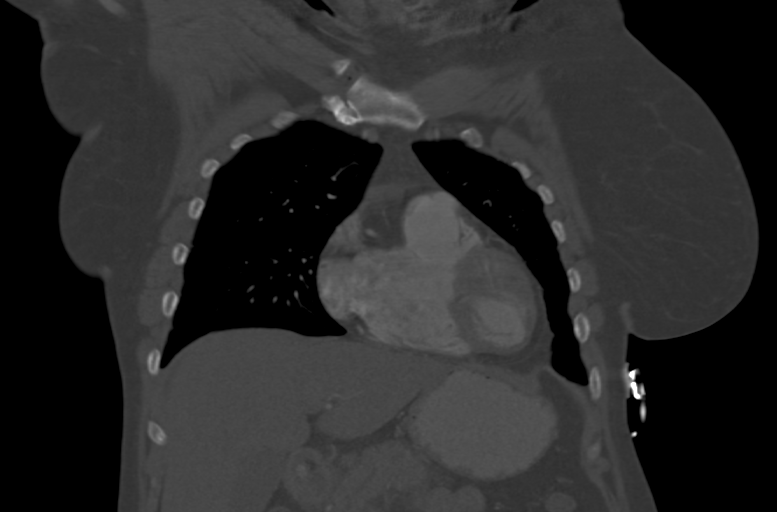
[im 83/124  soft-tissue]
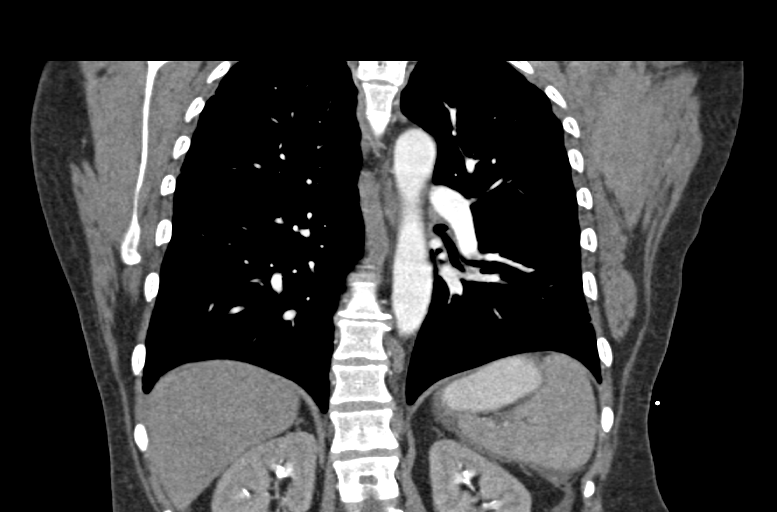

[11 of 46 positions shown; findings below may reference images not displayed]

COMPLICATIONS:
TECHNOLOGIST REPORTS: PT'S EXISTING 20 G IV DIFFUSELY INFILTRATED
DURING CT ANGIO CHEST--IV REMOVED, APPLIED COLD COMPRESS, PT WITH
MINIMAL DISCOMFORT USED PT'S OTHER EXISTING 22G WITH DECREASED
INJECTION RATE WITHOUT COMPLICATIONS PT'S RN NORMAN NOTIFIED IN ED

CONTRAST:  100 mL Isovue 370
FINDINGS: CTA CHEST FINDINGS

Cardiovascular: Satisfactory opacification of the pulmonary arteries
to the segmental level. No evidence of pulmonary embolism. Normal
heart size. No pericardial effusion. Normal caliber thoracic aorta.
No thoracic aortic aneurysm. No thoracic aortic dissection.

Mediastinum/Nodes: No enlarged mediastinal, hilar, or axillary lymph
nodes. Thyroid gland, trachea, and esophagus demonstrate no
significant findings.

Lungs/Pleura: Lungs are clear. No pleural effusion or pneumothorax.

Musculoskeletal: No chest wall abnormality. No acute or significant
osseous findings.

Review of the MIP images confirms the above findings.

CT ABDOMEN and PELVIS FINDINGS

Hepatobiliary: 14 mm hypodense, fluid attenuating right hepatic mass
with thin internal septation most consistent with a cyst. Liver is
otherwise normal. Normal gallbladder.

Pancreas: Pancreas enhances normally. Severe peripancreatic
inflammatory changes throughout the pancreas. No focal fluid
collection.

Spleen: Normal in size without focal abnormality.

Adrenals/Urinary Tract: Adrenal glands are unremarkable. Kidneys are
normal, without renal calculi, focal lesion, or hydronephrosis.
Bladder is unremarkable.

Stomach/Bowel: Stomach is within normal limits. Appendix appears
normal. No evidence of bowel wall thickening, distention, or
inflammatory changes.

Vascular/Lymphatic: No significant vascular findings are present. No
enlarged abdominal or pelvic lymph nodes.

Reproductive: No adnexal mass. Small right lower uterine mass
measuring 14 mm consistent with a fibroid. 2.2 cm uterine fundal
mass most consistent with a fibroid.

Other: Small amount of abdominal and pelvic free fluid. No abdominal
wall hernia.

Musculoskeletal: No acute osseous abnormality. No lytic or sclerotic
osseous lesion.

Review of the MIP images confirms the above findings.
IMPRESSION: 1. No with pulmonary embolus.
2. No aortic dissection or aortic aneurysm.
3. Findings most consistent with acute pancreatitis. No pancreatic
necrosis. No focal fluid collection to suggest a pseudocyst or
abscess.
